# Patient Record
Sex: Female | Born: 1945
Health system: Southern US, Community
[De-identification: ages and names within clinical notes are randomized; demographics above are authoritative.]

## PROBLEM LIST (undated history)

## (undated) DIAGNOSIS — F0390 Unspecified dementia without behavioral disturbance: Secondary | ICD-10-CM

## (undated) DIAGNOSIS — E7800 Pure hypercholesterolemia, unspecified: Secondary | ICD-10-CM

## (undated) DIAGNOSIS — E039 Hypothyroidism, unspecified: Secondary | ICD-10-CM

## (undated) HISTORY — DX: Unspecified dementia, unspecified severity, without behavioral disturbance, psychotic disturbance, mood disturbance, and anxiety: F03.90

## (undated) HISTORY — DX: Pure hypercholesterolemia, unspecified: E78.00

## (undated) HISTORY — DX: Hypothyroidism, unspecified: E03.9

## (undated) HISTORY — PX: NO PAST SURGERIES: SHX2092

---

## 1997-07-10 ENCOUNTER — Other Ambulatory Visit: Admission: RE | Admit: 1997-07-10 | Discharge: 1997-07-10 | Payer: Self-pay | Admitting: *Deleted

## 1998-07-25 ENCOUNTER — Other Ambulatory Visit: Admission: RE | Admit: 1998-07-25 | Discharge: 1998-07-25 | Payer: Self-pay | Admitting: *Deleted

## 1999-07-31 ENCOUNTER — Other Ambulatory Visit: Admission: RE | Admit: 1999-07-31 | Discharge: 1999-07-31 | Payer: Self-pay | Admitting: *Deleted

## 1999-11-23 ENCOUNTER — Ambulatory Visit (HOSPITAL_COMMUNITY): Admission: RE | Admit: 1999-11-23 | Discharge: 1999-11-23 | Payer: Self-pay | Admitting: Orthopedic Surgery

## 1999-11-23 ENCOUNTER — Encounter: Payer: Self-pay | Admitting: Orthopedic Surgery

## 2000-08-13 ENCOUNTER — Other Ambulatory Visit: Admission: RE | Admit: 2000-08-13 | Discharge: 2000-08-13 | Payer: Self-pay | Admitting: *Deleted

## 2001-08-16 ENCOUNTER — Other Ambulatory Visit: Admission: RE | Admit: 2001-08-16 | Discharge: 2001-08-16 | Payer: Self-pay | Admitting: *Deleted

## 2001-12-08 ENCOUNTER — Ambulatory Visit (HOSPITAL_COMMUNITY): Admission: RE | Admit: 2001-12-08 | Discharge: 2001-12-08 | Payer: Self-pay | Admitting: Gastroenterology

## 2002-08-18 ENCOUNTER — Other Ambulatory Visit: Admission: RE | Admit: 2002-08-18 | Discharge: 2002-08-18 | Payer: Self-pay | Admitting: *Deleted

## 2016-05-05 DIAGNOSIS — Z1231 Encounter for screening mammogram for malignant neoplasm of breast: Secondary | ICD-10-CM | POA: Diagnosis not present

## 2016-05-06 DIAGNOSIS — E782 Mixed hyperlipidemia: Secondary | ICD-10-CM | POA: Diagnosis not present

## 2016-05-06 DIAGNOSIS — E039 Hypothyroidism, unspecified: Secondary | ICD-10-CM | POA: Diagnosis not present

## 2016-05-06 DIAGNOSIS — R7301 Impaired fasting glucose: Secondary | ICD-10-CM | POA: Diagnosis not present

## 2016-05-06 DIAGNOSIS — E559 Vitamin D deficiency, unspecified: Secondary | ICD-10-CM | POA: Diagnosis not present

## 2016-05-09 DIAGNOSIS — M810 Age-related osteoporosis without current pathological fracture: Secondary | ICD-10-CM | POA: Insufficient documentation

## 2016-05-09 DIAGNOSIS — E039 Hypothyroidism, unspecified: Secondary | ICD-10-CM | POA: Diagnosis not present

## 2016-05-09 DIAGNOSIS — E782 Mixed hyperlipidemia: Secondary | ICD-10-CM | POA: Diagnosis not present

## 2016-05-09 DIAGNOSIS — K219 Gastro-esophageal reflux disease without esophagitis: Secondary | ICD-10-CM | POA: Diagnosis not present

## 2016-05-09 DIAGNOSIS — R030 Elevated blood-pressure reading, without diagnosis of hypertension: Secondary | ICD-10-CM | POA: Diagnosis not present

## 2016-05-09 DIAGNOSIS — E559 Vitamin D deficiency, unspecified: Secondary | ICD-10-CM | POA: Insufficient documentation

## 2016-05-09 DIAGNOSIS — R7301 Impaired fasting glucose: Secondary | ICD-10-CM | POA: Diagnosis present

## 2016-05-09 DIAGNOSIS — J309 Allergic rhinitis, unspecified: Secondary | ICD-10-CM | POA: Diagnosis not present

## 2016-07-31 DIAGNOSIS — H52203 Unspecified astigmatism, bilateral: Secondary | ICD-10-CM | POA: Diagnosis not present

## 2016-07-31 DIAGNOSIS — H11122 Conjunctival concretions, left eye: Secondary | ICD-10-CM | POA: Diagnosis not present

## 2016-07-31 DIAGNOSIS — H25813 Combined forms of age-related cataract, bilateral: Secondary | ICD-10-CM | POA: Diagnosis not present

## 2016-07-31 DIAGNOSIS — H11003 Unspecified pterygium of eye, bilateral: Secondary | ICD-10-CM | POA: Diagnosis not present

## 2016-08-04 DIAGNOSIS — L820 Inflamed seborrheic keratosis: Secondary | ICD-10-CM | POA: Diagnosis not present

## 2016-08-04 DIAGNOSIS — L821 Other seborrheic keratosis: Secondary | ICD-10-CM | POA: Diagnosis not present

## 2016-09-08 DIAGNOSIS — E559 Vitamin D deficiency, unspecified: Secondary | ICD-10-CM | POA: Diagnosis not present

## 2016-09-08 DIAGNOSIS — R7301 Impaired fasting glucose: Secondary | ICD-10-CM | POA: Diagnosis not present

## 2016-09-08 DIAGNOSIS — E782 Mixed hyperlipidemia: Secondary | ICD-10-CM | POA: Diagnosis not present

## 2016-09-08 DIAGNOSIS — E039 Hypothyroidism, unspecified: Secondary | ICD-10-CM | POA: Diagnosis not present

## 2016-09-12 DIAGNOSIS — R030 Elevated blood-pressure reading, without diagnosis of hypertension: Secondary | ICD-10-CM | POA: Diagnosis not present

## 2016-09-12 DIAGNOSIS — E782 Mixed hyperlipidemia: Secondary | ICD-10-CM | POA: Diagnosis not present

## 2016-09-12 DIAGNOSIS — R7301 Impaired fasting glucose: Secondary | ICD-10-CM | POA: Diagnosis not present

## 2016-09-12 DIAGNOSIS — E039 Hypothyroidism, unspecified: Secondary | ICD-10-CM | POA: Diagnosis not present

## 2016-11-24 DIAGNOSIS — J069 Acute upper respiratory infection, unspecified: Secondary | ICD-10-CM | POA: Diagnosis not present

## 2016-12-04 DIAGNOSIS — H6993 Unspecified Eustachian tube disorder, bilateral: Secondary | ICD-10-CM | POA: Diagnosis not present

## 2016-12-04 DIAGNOSIS — H906 Mixed conductive and sensorineural hearing loss, bilateral: Secondary | ICD-10-CM | POA: Diagnosis not present

## 2016-12-18 DIAGNOSIS — H66003 Acute suppurative otitis media without spontaneous rupture of ear drum, bilateral: Secondary | ICD-10-CM | POA: Diagnosis not present

## 2016-12-30 DIAGNOSIS — H6123 Impacted cerumen, bilateral: Secondary | ICD-10-CM | POA: Diagnosis not present

## 2016-12-30 DIAGNOSIS — H906 Mixed conductive and sensorineural hearing loss, bilateral: Secondary | ICD-10-CM | POA: Diagnosis not present

## 2017-01-15 DIAGNOSIS — E782 Mixed hyperlipidemia: Secondary | ICD-10-CM | POA: Diagnosis not present

## 2017-01-15 DIAGNOSIS — E039 Hypothyroidism, unspecified: Secondary | ICD-10-CM | POA: Diagnosis not present

## 2017-01-15 DIAGNOSIS — R7301 Impaired fasting glucose: Secondary | ICD-10-CM | POA: Diagnosis not present

## 2017-01-16 DIAGNOSIS — R7301 Impaired fasting glucose: Secondary | ICD-10-CM | POA: Diagnosis not present

## 2017-01-16 DIAGNOSIS — E782 Mixed hyperlipidemia: Secondary | ICD-10-CM | POA: Diagnosis not present

## 2017-01-16 DIAGNOSIS — E039 Hypothyroidism, unspecified: Secondary | ICD-10-CM | POA: Diagnosis not present

## 2017-01-16 DIAGNOSIS — R030 Elevated blood-pressure reading, without diagnosis of hypertension: Secondary | ICD-10-CM | POA: Diagnosis not present

## 2017-05-06 DIAGNOSIS — Z1231 Encounter for screening mammogram for malignant neoplasm of breast: Secondary | ICD-10-CM | POA: Diagnosis not present

## 2017-05-12 DIAGNOSIS — N6001 Solitary cyst of right breast: Secondary | ICD-10-CM | POA: Diagnosis not present

## 2017-05-13 DIAGNOSIS — E039 Hypothyroidism, unspecified: Secondary | ICD-10-CM | POA: Diagnosis not present

## 2017-05-13 DIAGNOSIS — R7301 Impaired fasting glucose: Secondary | ICD-10-CM | POA: Diagnosis not present

## 2017-05-13 DIAGNOSIS — E782 Mixed hyperlipidemia: Secondary | ICD-10-CM | POA: Diagnosis not present

## 2017-05-13 DIAGNOSIS — R030 Elevated blood-pressure reading, without diagnosis of hypertension: Secondary | ICD-10-CM | POA: Diagnosis not present

## 2017-05-15 DIAGNOSIS — R7301 Impaired fasting glucose: Secondary | ICD-10-CM | POA: Diagnosis not present

## 2017-05-15 DIAGNOSIS — E039 Hypothyroidism, unspecified: Secondary | ICD-10-CM | POA: Diagnosis not present

## 2017-05-15 DIAGNOSIS — R030 Elevated blood-pressure reading, without diagnosis of hypertension: Secondary | ICD-10-CM | POA: Diagnosis not present

## 2017-05-15 DIAGNOSIS — E782 Mixed hyperlipidemia: Secondary | ICD-10-CM | POA: Diagnosis not present

## 2017-09-09 DIAGNOSIS — E039 Hypothyroidism, unspecified: Secondary | ICD-10-CM | POA: Diagnosis not present

## 2017-09-09 DIAGNOSIS — E782 Mixed hyperlipidemia: Secondary | ICD-10-CM | POA: Diagnosis not present

## 2017-09-09 DIAGNOSIS — R7301 Impaired fasting glucose: Secondary | ICD-10-CM | POA: Diagnosis not present

## 2017-09-11 DIAGNOSIS — Z79890 Hormone replacement therapy (postmenopausal): Secondary | ICD-10-CM | POA: Diagnosis not present

## 2017-09-11 DIAGNOSIS — E559 Vitamin D deficiency, unspecified: Secondary | ICD-10-CM | POA: Diagnosis not present

## 2017-09-11 DIAGNOSIS — K219 Gastro-esophageal reflux disease without esophagitis: Secondary | ICD-10-CM | POA: Diagnosis not present

## 2017-09-11 DIAGNOSIS — R7301 Impaired fasting glucose: Secondary | ICD-10-CM | POA: Diagnosis not present

## 2017-09-11 DIAGNOSIS — M810 Age-related osteoporosis without current pathological fracture: Secondary | ICD-10-CM | POA: Diagnosis not present

## 2017-09-11 DIAGNOSIS — R030 Elevated blood-pressure reading, without diagnosis of hypertension: Secondary | ICD-10-CM | POA: Diagnosis not present

## 2017-09-11 DIAGNOSIS — Z8349 Family history of other endocrine, nutritional and metabolic diseases: Secondary | ICD-10-CM | POA: Diagnosis not present

## 2017-09-11 DIAGNOSIS — E782 Mixed hyperlipidemia: Secondary | ICD-10-CM | POA: Diagnosis not present

## 2017-09-11 DIAGNOSIS — J309 Allergic rhinitis, unspecified: Secondary | ICD-10-CM | POA: Diagnosis not present

## 2017-09-11 DIAGNOSIS — E039 Hypothyroidism, unspecified: Secondary | ICD-10-CM | POA: Diagnosis not present

## 2017-12-01 DIAGNOSIS — R030 Elevated blood-pressure reading, without diagnosis of hypertension: Secondary | ICD-10-CM | POA: Diagnosis not present

## 2017-12-01 DIAGNOSIS — R7301 Impaired fasting glucose: Secondary | ICD-10-CM | POA: Diagnosis not present

## 2017-12-01 DIAGNOSIS — E039 Hypothyroidism, unspecified: Secondary | ICD-10-CM | POA: Diagnosis not present

## 2017-12-01 DIAGNOSIS — E782 Mixed hyperlipidemia: Secondary | ICD-10-CM | POA: Diagnosis not present

## 2017-12-01 DIAGNOSIS — E559 Vitamin D deficiency, unspecified: Secondary | ICD-10-CM | POA: Diagnosis not present

## 2017-12-01 DIAGNOSIS — K219 Gastro-esophageal reflux disease without esophagitis: Secondary | ICD-10-CM | POA: Diagnosis not present

## 2017-12-01 DIAGNOSIS — M810 Age-related osteoporosis without current pathological fracture: Secondary | ICD-10-CM | POA: Diagnosis not present

## 2018-02-01 DIAGNOSIS — R7301 Impaired fasting glucose: Secondary | ICD-10-CM | POA: Diagnosis not present

## 2018-02-01 DIAGNOSIS — R030 Elevated blood-pressure reading, without diagnosis of hypertension: Secondary | ICD-10-CM | POA: Diagnosis not present

## 2018-02-01 DIAGNOSIS — E782 Mixed hyperlipidemia: Secondary | ICD-10-CM | POA: Diagnosis not present

## 2018-02-01 DIAGNOSIS — E039 Hypothyroidism, unspecified: Secondary | ICD-10-CM | POA: Diagnosis not present

## 2018-02-04 DIAGNOSIS — H6993 Unspecified Eustachian tube disorder, bilateral: Secondary | ICD-10-CM | POA: Diagnosis not present

## 2018-02-04 DIAGNOSIS — H9313 Tinnitus, bilateral: Secondary | ICD-10-CM | POA: Diagnosis not present

## 2018-02-05 DIAGNOSIS — H9319 Tinnitus, unspecified ear: Secondary | ICD-10-CM | POA: Diagnosis not present

## 2018-02-05 DIAGNOSIS — R030 Elevated blood-pressure reading, without diagnosis of hypertension: Secondary | ICD-10-CM | POA: Diagnosis not present

## 2018-02-05 DIAGNOSIS — E559 Vitamin D deficiency, unspecified: Secondary | ICD-10-CM | POA: Diagnosis not present

## 2018-02-05 DIAGNOSIS — M810 Age-related osteoporosis without current pathological fracture: Secondary | ICD-10-CM | POA: Diagnosis not present

## 2018-02-05 DIAGNOSIS — K219 Gastro-esophageal reflux disease without esophagitis: Secondary | ICD-10-CM | POA: Diagnosis not present

## 2018-02-05 DIAGNOSIS — E782 Mixed hyperlipidemia: Secondary | ICD-10-CM | POA: Diagnosis not present

## 2018-02-05 DIAGNOSIS — R7301 Impaired fasting glucose: Secondary | ICD-10-CM | POA: Diagnosis not present

## 2018-02-05 DIAGNOSIS — E039 Hypothyroidism, unspecified: Secondary | ICD-10-CM | POA: Diagnosis not present

## 2018-04-13 DIAGNOSIS — S299XXA Unspecified injury of thorax, initial encounter: Secondary | ICD-10-CM | POA: Diagnosis not present

## 2018-04-13 DIAGNOSIS — W500XXA Accidental hit or strike by another person, initial encounter: Secondary | ICD-10-CM | POA: Diagnosis not present

## 2018-06-14 DIAGNOSIS — E782 Mixed hyperlipidemia: Secondary | ICD-10-CM | POA: Diagnosis not present

## 2018-06-14 DIAGNOSIS — E039 Hypothyroidism, unspecified: Secondary | ICD-10-CM | POA: Diagnosis not present

## 2018-06-14 DIAGNOSIS — R7301 Impaired fasting glucose: Secondary | ICD-10-CM | POA: Diagnosis not present

## 2018-06-18 DIAGNOSIS — K219 Gastro-esophageal reflux disease without esophagitis: Secondary | ICD-10-CM | POA: Diagnosis not present

## 2018-06-18 DIAGNOSIS — E039 Hypothyroidism, unspecified: Secondary | ICD-10-CM | POA: Diagnosis not present

## 2018-06-18 DIAGNOSIS — H9319 Tinnitus, unspecified ear: Secondary | ICD-10-CM | POA: Diagnosis not present

## 2018-06-18 DIAGNOSIS — E559 Vitamin D deficiency, unspecified: Secondary | ICD-10-CM | POA: Diagnosis not present

## 2018-06-18 DIAGNOSIS — M810 Age-related osteoporosis without current pathological fracture: Secondary | ICD-10-CM | POA: Diagnosis not present

## 2018-06-18 DIAGNOSIS — R7301 Impaired fasting glucose: Secondary | ICD-10-CM | POA: Diagnosis not present

## 2018-06-18 DIAGNOSIS — R030 Elevated blood-pressure reading, without diagnosis of hypertension: Secondary | ICD-10-CM | POA: Diagnosis not present

## 2018-06-18 DIAGNOSIS — E782 Mixed hyperlipidemia: Secondary | ICD-10-CM | POA: Diagnosis not present

## 2018-07-27 DIAGNOSIS — Z1231 Encounter for screening mammogram for malignant neoplasm of breast: Secondary | ICD-10-CM | POA: Diagnosis not present

## 2018-07-27 DIAGNOSIS — Z803 Family history of malignant neoplasm of breast: Secondary | ICD-10-CM | POA: Diagnosis not present

## 2018-07-29 DIAGNOSIS — N6311 Unspecified lump in the right breast, upper outer quadrant: Secondary | ICD-10-CM | POA: Diagnosis not present

## 2018-07-29 DIAGNOSIS — N6001 Solitary cyst of right breast: Secondary | ICD-10-CM | POA: Diagnosis not present

## 2018-08-09 DIAGNOSIS — L853 Xerosis cutis: Secondary | ICD-10-CM | POA: Diagnosis not present

## 2018-08-09 DIAGNOSIS — L820 Inflamed seborrheic keratosis: Secondary | ICD-10-CM | POA: Diagnosis not present

## 2018-08-30 DIAGNOSIS — E559 Vitamin D deficiency, unspecified: Secondary | ICD-10-CM | POA: Diagnosis not present

## 2018-08-30 DIAGNOSIS — E890 Postprocedural hypothyroidism: Secondary | ICD-10-CM | POA: Diagnosis not present

## 2018-08-30 DIAGNOSIS — K219 Gastro-esophageal reflux disease without esophagitis: Secondary | ICD-10-CM | POA: Diagnosis not present

## 2018-08-30 DIAGNOSIS — M810 Age-related osteoporosis without current pathological fracture: Secondary | ICD-10-CM | POA: Diagnosis not present

## 2018-08-30 DIAGNOSIS — R7301 Impaired fasting glucose: Secondary | ICD-10-CM | POA: Diagnosis not present

## 2018-08-30 DIAGNOSIS — H9319 Tinnitus, unspecified ear: Secondary | ICD-10-CM | POA: Diagnosis not present

## 2018-08-30 DIAGNOSIS — E782 Mixed hyperlipidemia: Secondary | ICD-10-CM | POA: Diagnosis not present

## 2018-08-30 DIAGNOSIS — R030 Elevated blood-pressure reading, without diagnosis of hypertension: Secondary | ICD-10-CM | POA: Diagnosis not present

## 2018-10-22 DIAGNOSIS — R7301 Impaired fasting glucose: Secondary | ICD-10-CM | POA: Diagnosis not present

## 2018-10-22 DIAGNOSIS — E559 Vitamin D deficiency, unspecified: Secondary | ICD-10-CM | POA: Diagnosis not present

## 2018-10-22 DIAGNOSIS — E782 Mixed hyperlipidemia: Secondary | ICD-10-CM | POA: Diagnosis not present

## 2018-10-22 DIAGNOSIS — E039 Hypothyroidism, unspecified: Secondary | ICD-10-CM | POA: Diagnosis not present

## 2018-10-29 DIAGNOSIS — R7301 Impaired fasting glucose: Secondary | ICD-10-CM | POA: Diagnosis not present

## 2018-10-29 DIAGNOSIS — E039 Hypothyroidism, unspecified: Secondary | ICD-10-CM | POA: Diagnosis not present

## 2018-10-29 DIAGNOSIS — K219 Gastro-esophageal reflux disease without esophagitis: Secondary | ICD-10-CM | POA: Diagnosis not present

## 2018-10-29 DIAGNOSIS — E559 Vitamin D deficiency, unspecified: Secondary | ICD-10-CM | POA: Diagnosis not present

## 2018-10-29 DIAGNOSIS — H9319 Tinnitus, unspecified ear: Secondary | ICD-10-CM | POA: Diagnosis not present

## 2018-10-29 DIAGNOSIS — R030 Elevated blood-pressure reading, without diagnosis of hypertension: Secondary | ICD-10-CM | POA: Diagnosis not present

## 2018-10-29 DIAGNOSIS — E782 Mixed hyperlipidemia: Secondary | ICD-10-CM | POA: Diagnosis not present

## 2018-10-29 DIAGNOSIS — M810 Age-related osteoporosis without current pathological fracture: Secondary | ICD-10-CM | POA: Diagnosis not present

## 2018-11-23 DIAGNOSIS — K120 Recurrent oral aphthae: Secondary | ICD-10-CM | POA: Diagnosis not present

## 2018-12-01 DIAGNOSIS — R140 Abdominal distension (gaseous): Secondary | ICD-10-CM | POA: Diagnosis not present

## 2018-12-01 DIAGNOSIS — Z8371 Family history of colonic polyps: Secondary | ICD-10-CM | POA: Diagnosis not present

## 2018-12-07 DIAGNOSIS — E038 Other specified hypothyroidism: Secondary | ICD-10-CM | POA: Diagnosis not present

## 2018-12-07 DIAGNOSIS — Z23 Encounter for immunization: Secondary | ICD-10-CM | POA: Diagnosis not present

## 2018-12-07 DIAGNOSIS — R413 Other amnesia: Secondary | ICD-10-CM | POA: Diagnosis not present

## 2018-12-07 DIAGNOSIS — Z807 Family history of other malignant neoplasms of lymphoid, hematopoietic and related tissues: Secondary | ICD-10-CM | POA: Diagnosis not present

## 2018-12-07 DIAGNOSIS — Z1331 Encounter for screening for depression: Secondary | ICD-10-CM | POA: Diagnosis not present

## 2018-12-07 DIAGNOSIS — Z7689 Persons encountering health services in other specified circumstances: Secondary | ICD-10-CM | POA: Diagnosis not present

## 2018-12-10 ENCOUNTER — Other Ambulatory Visit: Payer: Self-pay | Admitting: Internal Medicine

## 2018-12-10 DIAGNOSIS — E039 Hypothyroidism, unspecified: Secondary | ICD-10-CM

## 2018-12-10 DIAGNOSIS — E538 Deficiency of other specified B group vitamins: Secondary | ICD-10-CM

## 2018-12-10 DIAGNOSIS — R413 Other amnesia: Secondary | ICD-10-CM

## 2018-12-10 DIAGNOSIS — Z807 Family history of other malignant neoplasms of lymphoid, hematopoietic and related tissues: Secondary | ICD-10-CM

## 2018-12-21 ENCOUNTER — Ambulatory Visit
Admission: RE | Admit: 2018-12-21 | Discharge: 2018-12-21 | Disposition: A | Discharge status: Home / Self Care | Payer: PPO | Source: Ambulatory Visit | Attending: Internal Medicine | Admitting: Internal Medicine

## 2018-12-21 ENCOUNTER — Other Ambulatory Visit: Payer: Self-pay

## 2018-12-21 DIAGNOSIS — R413 Other amnesia: Secondary | ICD-10-CM | POA: Diagnosis not present

## 2018-12-21 DIAGNOSIS — Z807 Family history of other malignant neoplasms of lymphoid, hematopoietic and related tissues: Secondary | ICD-10-CM

## 2018-12-21 DIAGNOSIS — E039 Hypothyroidism, unspecified: Secondary | ICD-10-CM

## 2018-12-21 DIAGNOSIS — E538 Deficiency of other specified B group vitamins: Secondary | ICD-10-CM

## 2018-12-27 ENCOUNTER — Ambulatory Visit: Payer: PPO | Admitting: Neurology

## 2018-12-28 DIAGNOSIS — E038 Other specified hypothyroidism: Secondary | ICD-10-CM | POA: Diagnosis not present

## 2018-12-28 DIAGNOSIS — F0390 Unspecified dementia without behavioral disturbance: Secondary | ICD-10-CM | POA: Diagnosis not present

## 2018-12-28 DIAGNOSIS — E538 Deficiency of other specified B group vitamins: Secondary | ICD-10-CM | POA: Diagnosis not present

## 2018-12-28 DIAGNOSIS — E7801 Familial hypercholesterolemia: Secondary | ICD-10-CM | POA: Diagnosis not present

## 2018-12-28 DIAGNOSIS — R413 Other amnesia: Secondary | ICD-10-CM | POA: Diagnosis not present

## 2018-12-28 DIAGNOSIS — E559 Vitamin D deficiency, unspecified: Secondary | ICD-10-CM | POA: Diagnosis not present

## 2018-12-28 DIAGNOSIS — R410 Disorientation, unspecified: Secondary | ICD-10-CM | POA: Diagnosis not present

## 2018-12-28 DIAGNOSIS — E46 Unspecified protein-calorie malnutrition: Secondary | ICD-10-CM | POA: Diagnosis not present

## 2019-01-07 ENCOUNTER — Other Ambulatory Visit: Payer: Self-pay

## 2019-01-07 ENCOUNTER — Encounter (INDEPENDENT_AMBULATORY_CARE_PROVIDER_SITE_OTHER): Payer: Self-pay | Admitting: Otolaryngology

## 2019-01-07 ENCOUNTER — Ambulatory Visit (INDEPENDENT_AMBULATORY_CARE_PROVIDER_SITE_OTHER): Payer: PPO | Admitting: Otolaryngology

## 2019-01-07 VITALS — Temp 97.2°F

## 2019-01-07 DIAGNOSIS — H6123 Impacted cerumen, bilateral: Secondary | ICD-10-CM

## 2019-01-07 NOTE — Progress Notes (Signed)
HPI: Kaitlyn Bryant is a 73 y.o. female who presents for evaluation of wax buildup in her ears.  She has history of decreased hearing as well as tinnitus.  No past medical history on file.  Social History   Socioeconomic History  . Marital status: Married    Spouse name: Not on file  . Number of children: Not on file  . Years of education: Not on file  . Highest education level: Not on file  Occupational History  . Not on file  Social Needs  . Financial resource strain: Not on file  . Food insecurity    Worry: Not on file    Inability: Not on file  . Transportation needs    Medical: Not on file    Non-medical: Not on file  Tobacco Use  . Smoking status: Never Smoker  . Smokeless tobacco: Never Used  Substance and Sexual Activity  . Alcohol use: Not on file  . Drug use: Not on file  . Sexual activity: Not on file  Lifestyle  . Physical activity    Days per week: Not on file    Minutes per session: Not on file  . Stress: Not on file  Relationships  . Social Herbalist on phone: Not on file    Gets together: Not on file    Attends religious service: Not on file    Active member of club or organization: Not on file    Attends meetings of clubs or organizations: Not on file    Relationship status: Not on file  Other Topics Concern  . Not on file  Social History Narrative  . Not on file   No family history on file. Not on File Prior to Admission medications   Not on File     Positive ROS: Otherwise negative  All other systems have been reviewed and were otherwise negative with the exception of those mentioned in the HPI and as above.  Physical Exam: Constitutional: Alert, well-appearing, no acute distress Ears: External ears without lesions or tenderness. Ear canals she has small ear canals bilaterally with moderate wax buildup that was cleaned with a curette.  TMs were clear. Nasal: External nose without lesions. Clear nasal passages Oral:  Oropharynx clear. Neck: No palpable adenopathy or masses Respiratory: Breathing comfortably  Skin: No facial/neck lesions or rash noted.  Cerumen impaction removal  Date/Time: 01/07/2019 2:54 PM Performed by: Rozetta Nunnery, MD Authorized by: Rozetta Nunnery, MD   Consent:    Consent obtained:  Verbal   Consent given by:  Patient   Risks discussed:  Pain and bleeding Procedure details:    Location:  L ear and R ear   Procedure type: curette   Post-procedure details:    Inspection:  TM intact and canal normal   Hearing quality:  Improved   Patient tolerance of procedure:  Tolerated well, no immediate complications    Assessment: Cerumen buildup  Plan: This was cleaned in the office she will follow-up as needed  Radene Journey, MD

## 2019-01-09 ENCOUNTER — Encounter (INDEPENDENT_AMBULATORY_CARE_PROVIDER_SITE_OTHER): Payer: Self-pay

## 2019-01-18 ENCOUNTER — Encounter: Payer: Self-pay | Admitting: Neurology

## 2019-01-18 ENCOUNTER — Ambulatory Visit (INDEPENDENT_AMBULATORY_CARE_PROVIDER_SITE_OTHER): Payer: PPO | Admitting: Neurology

## 2019-01-18 ENCOUNTER — Other Ambulatory Visit: Payer: Self-pay

## 2019-01-18 VITALS — BP 131/70 | HR 59 | Temp 97.3°F | Ht 64.5 in | Wt 119.0 lb

## 2019-01-18 DIAGNOSIS — E7800 Pure hypercholesterolemia, unspecified: Secondary | ICD-10-CM

## 2019-01-18 DIAGNOSIS — F0391 Unspecified dementia with behavioral disturbance: Secondary | ICD-10-CM

## 2019-01-18 DIAGNOSIS — F0390 Unspecified dementia without behavioral disturbance: Secondary | ICD-10-CM | POA: Diagnosis not present

## 2019-01-18 DIAGNOSIS — F0280 Dementia in other diseases classified elsewhere without behavioral disturbance: Secondary | ICD-10-CM | POA: Diagnosis not present

## 2019-01-18 DIAGNOSIS — E039 Hypothyroidism, unspecified: Secondary | ICD-10-CM | POA: Insufficient documentation

## 2019-01-18 DIAGNOSIS — G309 Alzheimer's disease, unspecified: Secondary | ICD-10-CM | POA: Diagnosis not present

## 2019-01-18 DIAGNOSIS — G3109 Other frontotemporal dementia: Secondary | ICD-10-CM

## 2019-01-18 DIAGNOSIS — F03918 Unspecified dementia, unspecified severity, with other behavioral disturbance: Secondary | ICD-10-CM | POA: Insufficient documentation

## 2019-01-18 NOTE — Patient Instructions (Signed)
Signs and Symptoms of Frontotemporal Dementia Each case of FTD is different, but the illness generally becomes more distinguishable from other brain conditions as it progresses. Symptoms may occur in clusters, and some may be more prevalent in early or later stages. Here is a list of ten signs of FTD: Poor judgment Loss of empathy Socially inappropriate behavior Lack of inhibition Repetitive compulsive behavior Inability to concentrate or plan Frequent, abrupt mood changes Speech difficulties Problems with balance or movement Memory loss    Frontotemporal Dementia  Frontotemporal dementia (FTD) is a rare, progressive brain disorder that causes memory loss. FTD describes a range of diseases that often start with changes in behavior, speech, and thinking. As FTD progresses, it affects short-term memory. Over time, FTD causes the frontal and temporal anterior lobes of the brain to shrink. These are the parts of the brain that control behavior and speech. There are three main types of FTD:  Behavioral variant FTD. This is the most common type and was previously called Pick disease.  Progressive agrammatic aphasia.  Semantic variant primary progressive aphasia. FTD is one of the most common causes of progressive memory loss in people younger than 60 years. The disease progresses faster in some people than in others. In some families, FTD can be associated with amyotrophic lateral sclerosis (ALS). There is no cure for FTD, but treatment and supportive care can improve a person's quality of life. What are the causes? The exact cause of this condition is not known, although many genetic mutations are known to cause the disease. What increases the risk? This condition is more likely to occur in people who have a family history of FTD. Family members of people with FTD should think about genetic counseling. What are the signs or symptoms? Signs and symptoms of FTD usually start between the ages  of 55-65 years. Symptoms may include:  Impulsive, poorly controlled, inappropriate, or embarrassing behavior.  Lack of motivation and interest (apathy).  Irritability and agitation.  Neglect of personal hygiene.  Withdrawal.  Inappropriate crying or laughing (pseudobulbar affect).  Repetitive behaviors.  Impulsive eating  Lack of concern for others.  Failure to recognize behavioral changes.  Loss of the ability to speak fluently.  Inability to write or read.  Slurred speech.  Loss of vocabulary.  New drug or alcohol abuse. Short-term memory loss is also a symptom later in the disease. How is this diagnosed? Your health care provider may suspect FTD if you have worsening behavior or speech difficulties. You may need to see specialists in brain and behavioral health who will collect your medical history and do a neurological examination. The following tests may be done:  Blood tests to rule out other causes, like vitamin deficiency, harmful effects of substances (toxicities), and infections.  Spinal tap (lumbar puncture) to check spinal fluid samples for abnormal proteins.  Imaging tests, such as a CT scan, PET scan, or MRI. These can show brain changes that suggest FTD or another brain disorder.  Memory testing (neuropsychological testing), which involves several hours of standardized tests to check the many functions of the brain. How is this treated? There is no cure for this condition and the progression of FTD cannot be stopped. Support at home is the most important aspect of managing FTD. Ask about caregiving resources in your community. Management of this condition may include:  Antidepressants to help with apathy.  Medicines to treat pseudobulbar affect.  Sedative medicines to control aggressive or dangerous behavior.  Speech and language therapy.  Behavioral therapy.  Occupational therapy to help with home safety and activity. Institutional or  supportive care may eventually be needed. Follow these instructions at home: Eating and drinking  Follow a healthy diet. Eat foods that are high in fiber, such as fresh fruits and vegetables, whole grains, and beans.  Avoid having too much sugar and caffeine in your diet.  Watch for signs of compulsive eating, which can lead to other health problems.  Do not drink alcohol.  Drink enough fluid to keep your urine pale yellow. Lifestyle  Keep a regular routine.  Avoid stress and new situations.  Avoid any activities that may trigger aggressive behavior.  Schedule regular, enjoyable, and supervised physical activity. General instructions  Take over-the-counter and prescription medicines only as told by your health care provider.  If you were given a bracelet that tracks your location, make sure you wear it.  Work with your health care provider to determine what you need help with and what your safety needs are.  Keep all follow-up visits, including any therapy visits, as told by your health care provider. This is important. Contact a health care provider if:  Your symptoms change or get worse.  It becomes more difficult or stressful to be cared for at home. Get help right away if:  It is no longer possible to be cared for at home.  You or your family members become concerned for your safety. Summary  Frontotemporal dementia (FTD) is a rare, progressive brain disorder that causes memory loss.  There is no cure for this condition and the progression of FTD cannot be stopped.  Support at home is the most important aspect of managing FTD. Ask about caregiving resources in your community.  Work with your health care provider to determine what you need help with and what your safety needs are. This information is not intended to replace advice given to you by your health care provider. Make sure you discuss any questions you have with your health care provider. Document  Released: 01/10/2002 Document Revised: 05/13/2018 Document Reviewed: 04/15/2016 Elsevier Patient Education  [place].

## 2019-01-18 NOTE — Progress Notes (Signed)
[facility]    Provider:  Dr Lucia Gaskins Requesting Provider: Charlane Ferretti, DO Primary Care Provider:  Altheimer, Casimiro Needle, MD  CC:  Memory loss  HPI:  Kaitlyn Bryant is a 73 y.o. female here as requested by Charlane Ferretti, DO for memory loss. PMHx hypothyroid,heart disease, hyperlipidemia on Repatha, hypothyroidism status post ablation stable on levothyroxine..  I reviewed Dr. Acie Fredrickson notes, and patient is here with her husband who provides much information.  Patient is independent with bathing dressing, toileting, feeding also using the telephone, shopping, meal preparation, light housework, laundry, arranging transportation, taking medications correctly and managing finances.  Mini-Mental status exam was 22 out of 30.  Never smoker, exercises 5 times a week swimming and walking.  She was seen last for yearly physical, last year she has been exhibiting behaviors commonly associated with dementia such as increased short-term memory loss, struggles with insight, poor judgment and decision-making, difficulty in processing new information, difficulty in finding words to express herself, also conserver driving skills that she no longer has the judgment and skills to continue driving.  Husband expresses his believe that cognitive and laboratory testing is needed at the time and to provide better insight into future care as well as make a diagnosis.  She was advised not to drive anymore.  Short term memory loss started a year ago in the summer. They noticed judgement issues, she felt prey to a number of internet scams, lost a lot of money. Also lots of denial that she did anything wrong, she was convinced one of the scams was the FBI and the other one was the [place] nations and [place]. She started being very secretive about it all. Poor insight. Was also short-term memory thought it was just aging. Judgement and insight poor, poor choices. Family noticed it. Very judgemental, she is  using the word "asshole" and she wouldn;t do that in the past. She repetitively watches shows and watches them multiple times the same episode, repetitive compulsive behavior. Difficult planning, not logical. Also short-term memory and repeating conversations, searching for words.   Reviewed notes, labs and imaging from outside physicians, which showed:  reviewed CT of the head images and agree with the following: No acute or reversible finding. Generalized brain atrophy with relative frontal lobe predominance.  Labs include BUN 14, creatinine 1.2, otherwise unremarkable CBC and CMP, RPR nonreactive, B12 996.  Review of Systems: Patient complains of symptoms per HPI as well as the following symptoms: memory loss. Pertinent negatives and positives per HPI. All others negative.   Social History   Socioeconomic History  . Marital status: Married    Spouse name: Not on file  . Number of children: Not on file  . Years of education: Not on file  . Highest education level: Not on file  Occupational History  . Not on file  Tobacco Use  . Smoking status: Never Smoker  . Smokeless tobacco: Never Used  Substance and Sexual Activity  . Alcohol use: Never  . Drug use: Never  . Sexual activity: Not on file  Other Topics Concern  . Not on file  Social History Narrative   Lives with husband   Social Determinants of Health   Financial Resource Strain:   . Difficulty of Paying Living Expenses: Not on file  Food Insecurity:   . Worried About Programme researcher, broadcasting/film/video in the Last Year: Not on file  . Ran Out of Food in the Last Year: Not on file  Transportation  Needs:   . Lack of Transportation (Medical): Not on file  . Lack of Transportation (Non-Medical): Not on file  Physical Activity:   . Days of Exercise per Week: Not on file  . Minutes of Exercise per Session: Not on file  Stress:   . Feeling of Stress : Not on file  Social Connections:   . Frequency of Communication with Friends and  Family: Not on file  . Frequency of Social Gatherings with Friends and Family: Not on file  . Attends Religious Services: Not on file  . Active Member of Clubs or Organizations: Not on file  . Attends BankerClub or Organization Meetings: Not on file  . Marital Status: Not on file  Intimate Partner Violence:   . Fear of Current or Ex-Partner: Not on file  . Emotionally Abused: Not on file  . Physically Abused: Not on file  . Sexually Abused: Not on file    Family History  Problem Relation Age of Onset  . Liver disease Mother   . Dementia Mother   . Hypothyroidism Sister   . Liver cancer Sister   . Cancer Sister        splenic lymphoma  . Cirrhosis Sister        alcoholism    Past Medical History:  Diagnosis Date  . Dementia (HCC)   . High cholesterol   . Hypothyroid     Patient Active Problem List   Diagnosis Date Noted  . Dementia with behavioral disturbance (HCC) 01/18/2019  . Hypothyroid   . High cholesterol     Past Surgical History:  Procedure Laterality Date  . NO PAST SURGERIES      Current Outpatient Medications  Medication Sig Dispense Refill  . Biotin 5000 MCG CAPS Take by mouth daily.    . Cholecalciferol 50 MCG (2000 UT) CHEW Take 1 gummie once a day    . Coenzyme Q10 (COQ-10 PO) Take 300 mg by mouth daily.    Marland Kitchen. levothyroxine (SYNTHROID) 75 MCG tablet TAKE 1 TABLET IN THE MORNING ON EMPTY STOMACH FOR THYROID    . Multiple Vitamin (MULTI-VITAMIN) tablet Take 1 tablet once a day    . Pseudoephedrine HCl (SUDAFED PO) Take by mouth.    Marland Kitchen. REPATHA SURECLICK 140 MG/ML SOAJ Inject 1 mL into the skin every 14 (fourteen) days.    Marland Kitchen. UNABLE TO FIND daily. Med Name: Areds 2    . EPINEPHrine 0.3 mg/0.3 mL IJ SOAJ injection Inject as directed as needed for severe allergic reaction     No current facility-administered medications for this visit.    Allergies as of 01/18/2019 - Review Complete 01/18/2019  Allergen Reaction Noted  . Methimazole Anaphylaxis 03/30/2016    . Sulfamethoxazole Shortness Of Breath 05/08/2016  . Alendronate Other (See Comments) 05/08/2016  . Ibandronic acid Other (See Comments) 05/08/2016  . Risedronate Other (See Comments) 05/08/2016  . Iodine Other (See Comments) 03/30/2016  . Amoxicillin-pot clavulanate Rash 01/16/2017  . Aspirin Other (See Comments) 05/08/2016  . Atorvastatin Other (See Comments) 03/30/2016  . Colesevelam  05/08/2016  . Ezetimibe Other (See Comments) 03/30/2016  . Fluvastatin Other (See Comments) 03/30/2016  . Niacin Rash 03/30/2016  . Other Other (See Comments) 05/08/2016  . Peppermint flavor Other (See Comments) 05/08/2016  . Pitavastatin Other (See Comments) 03/30/2016  . Raloxifene Other (See Comments) 05/08/2016  . Rosuvastatin Other (See Comments) 03/30/2016  . Shellfish allergy Swelling and Rash 01/18/2019    Vitals: BP 131/70 (BP Location: Right Arm, Patient  Position: Sitting)   Pulse (!) 59   Temp (!) 97.3 F (36.3 C) Comment: husband 98.3  Ht 5' 4.5" (1.638 m)   Wt 119 lb (54 kg)   BMI 20.11 kg/m  Last Weight:  Wt Readings from Last 1 Encounters:  01/18/19 119 lb (54 kg)   Last Height:   Ht Readings from Last 1 Encounters:  01/18/19 5' 4.5" (1.638 m)     Physical exam: Exam: Gen: NAD, conversant, well nourised, thin, well groomed                     CV: RRR, no MRG. No Carotid Bruits. No peripheral edema, warm, nontender Eyes: Conjunctivae clear without exudates or hemorrhage  Neuro: Detailed Neurologic Exam  Speech:    Speech is normal; fluent and spontaneous with normal comprehension.  Cognition:    The patient is oriented to person, place, and time;     recent and remote memory impaired;     language fluent;     Impaired attention, concentration,  fund of knowledge Cranial Nerves:    The pupils are equal, round, and reactive to light. The fundi are flat. Visual fields are full to finger confrontation. Extraocular movements are intact. Trigeminal sensation is intact  and the muscles of mastication are normal. The face is symmetric. The palate elevates in the midline. Hearing intact. Voice is normal. Shoulder shrug is normal. The tongue has normal motion without fasciculations.   Coordination:    Normal finger to nose and heel to shin.   Gait:    Heel-toe and tandem gait are normal.   Motor Observation:    No asymmetry, no atrophy, and no involuntary movements noted. Tone:    Normal muscle tone.    Posture:    Posture is normal. normal erect    Strength:    Strength is V/V in the upper and lower limbs.      Sensation: intact to LT     Reflex Exam:  DTR's:    Deep tendon reflexes in the upper and lower extremities are brisk bilaterally.   Toes:    The toes are downgoing bilaterally.   Clonus:    2-3 beats clonus AJs.   +jaw jerk and +snout (frontal release signs)  Assessment/Plan: 73 year old patient here with her husband for cognitive changes concerning for dementia. They are more focused on her memory loss with a family history of Alzheimers however she presented with poor judgement, repetitive compulsive behavior, change in personality and swearing and  CT scan of her head showed frontotemporal atrophy predominance questioning FTD or overlap of both Alzheimers and FTD need further studies. MMSE 22/30.  -FDG nuclear medicine PET scan for dementia to differentiate between Alzheimer's versus frontotemporal which can aid in treatment and availability of clinical trials -Formal neurocognitive testing -Patient to return to clinic after the above completed or in 4 months - discussed at length with patient and husband.  Orders Placed This Encounter  Procedures  . NM PET Metabolic Brain  . Ambulatory referral to Neuropsychology    Cc: Sueanne Margarita, DO,  Altheimer, Legrand Como, MD  Sarina Ill, MD  [facility] [address] [place], [place] [address]  Phone [phone] Fax [phone]

## 2019-02-07 DIAGNOSIS — M810 Age-related osteoporosis without current pathological fracture: Secondary | ICD-10-CM | POA: Diagnosis not present

## 2019-02-07 DIAGNOSIS — E782 Mixed hyperlipidemia: Secondary | ICD-10-CM | POA: Diagnosis not present

## 2019-02-07 DIAGNOSIS — R7301 Impaired fasting glucose: Secondary | ICD-10-CM | POA: Diagnosis not present

## 2019-02-07 DIAGNOSIS — E039 Hypothyroidism, unspecified: Secondary | ICD-10-CM | POA: Diagnosis not present

## 2019-02-07 DIAGNOSIS — R030 Elevated blood-pressure reading, without diagnosis of hypertension: Secondary | ICD-10-CM | POA: Diagnosis not present

## 2019-02-09 DIAGNOSIS — E039 Hypothyroidism, unspecified: Secondary | ICD-10-CM | POA: Diagnosis not present

## 2019-02-09 DIAGNOSIS — K219 Gastro-esophageal reflux disease without esophagitis: Secondary | ICD-10-CM | POA: Diagnosis not present

## 2019-02-15 DIAGNOSIS — R140 Abdominal distension (gaseous): Secondary | ICD-10-CM | POA: Diagnosis not present

## 2019-02-24 ENCOUNTER — Ambulatory Visit: Payer: PPO | Attending: Internal Medicine

## 2019-02-24 ENCOUNTER — Encounter (HOSPITAL_COMMUNITY): Payer: PPO

## 2019-02-24 DIAGNOSIS — Z23 Encounter for immunization: Secondary | ICD-10-CM

## 2019-02-24 NOTE — Progress Notes (Signed)
   Covid-19 Vaccination Clinic  Name:  TZIPPORAH NAGORSKI    MRN: [id number] DOB: Jun 10, 1945  02/24/2019  Ms. Spadoni was observed post Covid-19 immunization for 15 minutes without incidence. She was provided with Vaccine Information Sheet and instruction to access the V-Safe system.   Ms. Delaney was instructed to call 911 with any severe reactions post vaccine: Marland Kitchen Difficulty breathing  . Swelling of your face and throat  . A fast heartbeat  . A bad rash all over your body  . Dizziness and weakness    Immunizations Administered    Name Date Dose VIS Date Route   Pfizer COVID-19 Vaccine 02/24/2019  9:51 AM 0.3 mL 01/14/2019 Intramuscular   Manufacturer: [employer], [employer]   Lot: [id number]   NDC: [address]-0

## 2019-03-17 ENCOUNTER — Ambulatory Visit: Payer: PPO | Attending: Internal Medicine

## 2019-03-17 DIAGNOSIS — Z23 Encounter for immunization: Secondary | ICD-10-CM | POA: Insufficient documentation

## 2019-03-17 NOTE — Progress Notes (Signed)
   Covid-19 Vaccination Clinic  Name:  TAYVIA FAUGHNAN    MRN: [id number] DOB: 17-May-1945  03/17/2019  Ms. Verret was observed post Covid-19 immunization for 15 minutes without incidence. She was provided with Vaccine Information Sheet and instruction to access the V-Safe system.   Ms. Gillott was instructed to call 911 with any severe reactions post vaccine: Marland Kitchen Difficulty breathing  . Swelling of your face and throat  . A fast heartbeat  . A bad rash all over your body  . Dizziness and weakness    Immunizations Administered    Name Date Dose VIS Date Route   Pfizer COVID-19 Vaccine 03/17/2019 10:11 AM 0.3 mL 01/14/2019 Intramuscular   Manufacturer: [employer], [employer]   Lot: [id number]   NDC: [address]-5

## 2019-04-08 DIAGNOSIS — E559 Vitamin D deficiency, unspecified: Secondary | ICD-10-CM | POA: Diagnosis not present

## 2019-04-08 DIAGNOSIS — F0391 Unspecified dementia with behavioral disturbance: Secondary | ICD-10-CM | POA: Diagnosis not present

## 2019-04-08 DIAGNOSIS — E038 Other specified hypothyroidism: Secondary | ICD-10-CM | POA: Diagnosis not present

## 2019-04-08 DIAGNOSIS — E782 Mixed hyperlipidemia: Secondary | ICD-10-CM | POA: Diagnosis not present

## 2019-04-08 DIAGNOSIS — R7301 Impaired fasting glucose: Secondary | ICD-10-CM | POA: Diagnosis not present

## 2019-04-08 DIAGNOSIS — K219 Gastro-esophageal reflux disease without esophagitis: Secondary | ICD-10-CM | POA: Diagnosis not present

## 2019-04-08 DIAGNOSIS — R030 Elevated blood-pressure reading, without diagnosis of hypertension: Secondary | ICD-10-CM | POA: Diagnosis not present

## 2019-04-08 DIAGNOSIS — M810 Age-related osteoporosis without current pathological fracture: Secondary | ICD-10-CM | POA: Diagnosis not present

## 2019-04-08 DIAGNOSIS — E039 Hypothyroidism, unspecified: Secondary | ICD-10-CM | POA: Diagnosis not present

## 2019-04-12 ENCOUNTER — Ambulatory Visit (HOSPITAL_COMMUNITY)
Admission: RE | Admit: 2019-04-12 | Discharge: 2019-04-12 | Disposition: A | Discharge status: Home / Self Care | Payer: PPO | Source: Ambulatory Visit | Attending: Neurology | Admitting: Neurology

## 2019-04-12 ENCOUNTER — Other Ambulatory Visit: Payer: Self-pay

## 2019-04-12 DIAGNOSIS — Z79899 Other long term (current) drug therapy: Secondary | ICD-10-CM | POA: Diagnosis not present

## 2019-04-12 DIAGNOSIS — F0280 Dementia in other diseases classified elsewhere without behavioral disturbance: Secondary | ICD-10-CM | POA: Insufficient documentation

## 2019-04-12 DIAGNOSIS — G3109 Other frontotemporal dementia: Secondary | ICD-10-CM | POA: Insufficient documentation

## 2019-04-12 DIAGNOSIS — F0391 Unspecified dementia with behavioral disturbance: Secondary | ICD-10-CM | POA: Diagnosis not present

## 2019-04-12 DIAGNOSIS — F0390 Unspecified dementia without behavioral disturbance: Secondary | ICD-10-CM | POA: Diagnosis not present

## 2019-04-12 DIAGNOSIS — G309 Alzheimer's disease, unspecified: Secondary | ICD-10-CM | POA: Insufficient documentation

## 2019-04-12 LAB — GLUCOSE, CAPILLARY: Glucose-Capillary: 91 mg/dL (ref 70–99)

## 2019-04-12 MED ORDER — FLUDEOXYGLUCOSE F - 18 (FDG) INJECTION
10.2800 | Freq: Once | INTRAVENOUS | Status: AC
  Administered 2019-04-12: 10.28 via INTRAVENOUS

## 2019-04-14 ENCOUNTER — Telehealth: Payer: Self-pay | Admitting: Neurology

## 2019-04-14 NOTE — Telephone Encounter (Signed)
I called and spoke with wife, I have informed her the results of the PET scan below. Her husband was not in the home, I asked patient if I could call back and discussed the results with her husband as well and she said there was no need. I would really like to make sure that the husband knows the results however patient again told me no need to talk to them. We do have an appointment in April and I will speak with both of them at that time.   NM PET Scan: Decreased relative cortical metabolism within the anterior frontal lobes is a pattern typical of frontotemporal dementia pathology.

## 2019-04-27 DIAGNOSIS — M810 Age-related osteoporosis without current pathological fracture: Secondary | ICD-10-CM | POA: Diagnosis not present

## 2019-04-27 DIAGNOSIS — M85851 Other specified disorders of bone density and structure, right thigh: Secondary | ICD-10-CM | POA: Diagnosis not present
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
## 2019-05-23 ENCOUNTER — Other Ambulatory Visit: Payer: Self-pay

## 2019-05-23 ENCOUNTER — Ambulatory Visit: Payer: Medicare HMO | Admitting: Neurology

## 2019-05-23 ENCOUNTER — Encounter: Payer: Self-pay | Admitting: Neurology

## 2019-05-23 VITALS — BP 137/74 | HR 57 | Temp 97.1°F | Ht 64.5 in | Wt 120.0 lb

## 2019-05-23 DIAGNOSIS — F0280 Dementia in other diseases classified elsewhere without behavioral disturbance: Secondary | ICD-10-CM | POA: Diagnosis not present

## 2019-05-23 DIAGNOSIS — F0281 Dementia in other diseases classified elsewhere with behavioral disturbance: Secondary | ICD-10-CM | POA: Diagnosis not present

## 2019-05-23 DIAGNOSIS — G3109 Other frontotemporal dementia: Secondary | ICD-10-CM

## 2019-05-23 DIAGNOSIS — F02818 Dementia in other diseases classified elsewhere, unspecified severity, with other behavioral disturbance: Secondary | ICD-10-CM

## 2019-05-23 MED ORDER — CITALOPRAM HYDROBROMIDE 10 MG PO TABS: 10.0000 mg | ORAL_TABLET | Freq: Every day | ORAL | 3 refills | Status: DC

## 2019-05-23 NOTE — Patient Instructions (Addendum)
Start Celexa Referral to Kaitlyn Bryant (social worker) Kaitlyn Bryant. Kaitlyn Bryant, [place]  Therapist  [place]  [phone]  Referral to Kaitlyn Bryant (psychiatry)  Address: [address], [place], [place] [address] Phone: [phone]  Citalopram tablets What is this medicine? CITALOPRAM (sye TAL oh pram) is a medicine for depression. This medicine may be used for other purposes; ask your health care provider or pharmacist if you have questions. COMMON BRAND NAME(S): Celexa What should I tell my health care provider before I take this medicine? They need to know if you have any of these conditions:  bleeding disorders  bipolar disorder or a family history of bipolar disorder  glaucoma  heart disease  history of irregular heartbeat  kidney disease  liver disease  low levels of magnesium or potassium in the blood  receiving electroconvulsive therapy  seizures  suicidal thoughts, plans, or attempt; a previous suicide attempt by you or a family member  take medicines that treat or prevent blood clots  thyroid disease  an unusual or allergic reaction to citalopram, escitalopram, other medicines, foods, dyes, or preservatives  pregnant or trying to become pregnant  breast-feeding How should I use this medicine? Take this medicine by mouth with a glass of water. Follow the directions on the prescription label. You can take it with or without food. Take your medicine at regular intervals. Do not take your medicine more often than directed. Do not stop taking this medicine suddenly except upon the advice of your doctor. Stopping this medicine too quickly may cause serious side effects or your condition may worsen. A special MedGuide will be given to you by the pharmacist with each prescription and refill. Be sure to read this information carefully each time. Talk to your pediatrician regarding the use of this medicine in children. Special care may be needed.  Patients over 44 years old may have a stronger reaction and need a smaller dose. Overdosage: If you think you have taken too much of this medicine contact a poison control center or emergency room at once. NOTE: This medicine is only for you. Do not share this medicine with others. What if I miss a dose? If you miss a dose, take it as soon as you can. If it is almost time for your next dose, take only that dose. Do not take double or extra doses. What may interact with this medicine? Do not take this medicine with any of the following medications:  certain medicines for fungal infections like fluconazole, itraconazole, ketoconazole, posaconazole, voriconazole  cisapride  dronedarone  escitalopram  linezolid  MAOIs like Carbex, Eldepryl, Marplan, Nardil, and Parnate  methylene blue (injected into a vein)  pimozide  thioridazine This medicine may also interact with the following medications:  alcohol  amphetamines  aspirin and aspirin-like medicines  carbamazepine  certain medicines for depression, anxiety, or psychotic disturbances  certain medicines for infections like chloroquine, clarithromycin, erythromycin, furazolidone, isoniazid, pentamidine  certain medicines for migraine headaches like almotriptan, eletriptan, frovatriptan, naratriptan, rizatriptan, sumatriptan, zolmitriptan  certain medicines for sleep  certain medicines that treat or prevent blood clots like dalteparin, enoxaparin, warfarin  cimetidine  diuretics  dofetilide  fentanyl  lithium  methadone  metoprolol  NSAIDs, medicines for pain and inflammation, like ibuprofen or naproxen  omeprazole  other medicines that prolong the QT interval (cause an abnormal heart rhythm)  procarbazine  rasagiline  supplements like St. John's wort, kava kava, valerian  tramadol  tryptophan  ziprasidone This list may not  describe all possible interactions. Give your health care provider a list  of all the medicines, herbs, non-prescription drugs, or dietary supplements you use. Also tell them if you smoke, drink alcohol, or use illegal drugs. Some items may interact with your medicine. What should I watch for while using this medicine? Tell your doctor if your symptoms do not get better or if they get worse. Visit your doctor or health care professional for regular checks on your progress. Because it may take several weeks to see the full effects of this medicine, it is important to continue your treatment as prescribed by your doctor. Patients and their families should watch out for new or worsening thoughts of suicide or depression. Also watch out for sudden changes in feelings such as feeling anxious, agitated, panicky, irritable, hostile, aggressive, impulsive, severely restless, overly excited and hyperactive, or not being able to sleep. If this happens, especially at the beginning of treatment or after a change in dose, call your health care professional. Bonita Quin may get drowsy or dizzy. Do not drive, use machinery, or do anything that needs mental alertness until you know how this medicine affects you. Do not stand or sit up quickly, especially if you are an older patient. This reduces the risk of dizzy or fainting spells. Alcohol may interfere with the effect of this medicine. Avoid alcoholic drinks. Your mouth may get dry. Chewing sugarless gum or sucking hard candy, and drinking plenty of water will help. Contact your doctor if the problem does not go away or is severe. What side effects may I notice from receiving this medicine? Side effects that you should report to your doctor or health care professional as soon as possible:  allergic reactions like skin rash, itching or hives, swelling of the face, lips, or tongue  anxious  black, tarry stools  breathing problems  changes in vision  chest pain  confusion  elevated mood, decreased need for sleep, racing thoughts, impulsive  behavior  eye pain  fast, irregular heartbeat  feeling faint or lightheaded, falls  feeling agitated, angry, or irritable  hallucination, loss of contact with reality  loss of balance or coordination  loss of memory  painful or prolonged erections  restlessness, pacing, inability to keep still  seizures  stiff muscles  suicidal thoughts or other mood changes  trouble sleeping  unusual bleeding or bruising  unusually weak or tired  vomiting Side effects that usually do not require medical attention (report to your doctor or health care professional if they continue or are bothersome):  change in appetite or weight  change in sex drive or performance  dizziness  headache  increased sweating  indigestion, nausea  tremors This list may not describe all possible side effects. Call your doctor for medical advice about side effects. You may report side effects to FDA at 1-800-FDA-1088. Where should I keep my medicine? Keep out of reach of children. Store at room temperature between 15 and 30 degrees C (59 and 86 degrees F). Throw away any unused medicine after the expiration date. NOTE: This sheet is a summary. It may not cover all possible information. If you have questions about this medicine, talk to your doctor, pharmacist, or health care provider.  2020 Elsevier/Gold Standard (2018-01-11 09:05:36)    Frontotemporal Dementia  Frontotemporal dementia (FTD) is a rare, progressive brain disorder that causes memory loss. FTD describes a range of diseases that often start with changes in behavior, speech, and thinking. As FTD progresses, it affects short-term  memory. Over time, FTD causes the frontal and temporal anterior lobes of the brain to shrink. These are the parts of the brain that control behavior and speech. There are three main types of FTD:  Behavioral variant FTD. This is the most common type and was previously called Pick disease.  Progressive  agrammatic aphasia.  Semantic variant primary progressive aphasia. FTD is one of the most common causes of progressive memory loss in people younger than 60 years. The disease progresses faster in some people than in others. In some families, FTD can be associated with amyotrophic lateral sclerosis (ALS). There is no cure for FTD, but treatment and supportive care can improve a person's quality of life. What are the causes? The exact cause of this condition is not known, although many genetic mutations are known to cause the disease. What increases the risk? This condition is more likely to occur in people who have a family history of FTD. Family members of people with FTD should think about genetic counseling. What are the signs or symptoms? Signs and symptoms of FTD usually start between the ages of 55-65 years. Symptoms may include:  Impulsive, poorly controlled, inappropriate, or embarrassing behavior.  Lack of motivation and interest (apathy).  Irritability and agitation.  Neglect of personal hygiene.  Withdrawal.  Inappropriate crying or laughing (pseudobulbar affect).  Repetitive behaviors.  Impulsive eating  Lack of concern for others.  Failure to recognize behavioral changes.  Loss of the ability to speak fluently.  Inability to write or read.  Slurred speech.  Loss of vocabulary.  New drug or alcohol abuse. Short-term memory loss is also a symptom later in the disease. How is this diagnosed? Your health care provider may suspect FTD if you have worsening behavior or speech difficulties. You may need to see specialists in brain and behavioral health who will collect your medical history and do a neurological examination. The following tests may be done:  Blood tests to rule out other causes, like vitamin deficiency, harmful effects of substances (toxicities), and infections.  Spinal tap (lumbar puncture) to check spinal fluid samples for abnormal proteins.   Imaging tests, such as a CT scan, PET scan, or MRI. These can show brain changes that suggest FTD or another brain disorder.  Memory testing (neuropsychological testing), which involves several hours of standardized tests to check the many functions of the brain. How is this treated? There is no cure for this condition and the progression of FTD cannot be stopped. Support at home is the most important aspect of managing FTD. Ask about caregiving resources in your community. Management of this condition may include:  Antidepressants to help with apathy.  Medicines to treat pseudobulbar affect.  Sedative medicines to control aggressive or dangerous behavior.  Speech and language therapy.  Behavioral therapy.  Occupational therapy to help with home safety and activity. Institutional or supportive care may eventually be needed. Follow these instructions at home: Eating and drinking  Follow a healthy diet. Eat foods that are high in fiber, such as fresh fruits and vegetables, whole grains, and beans.  Avoid having too much sugar and caffeine in your diet.  Watch for signs of compulsive eating, which can lead to other health problems.  Do not drink alcohol.  Drink enough fluid to keep your urine pale yellow. Lifestyle  Keep a regular routine.  Avoid stress and new situations.  Avoid any activities that may trigger aggressive behavior.  Schedule regular, enjoyable, and supervised physical activity. General instructions  Take over-the-counter and prescription medicines only as told by your health care provider.  If you were given a bracelet that tracks your location, make sure you wear it.  Work with your health care provider to determine what you need help with and what your safety needs are.  Keep all follow-up visits, including any therapy visits, as told by your health care provider. This is important. Contact a health care provider if:  Your symptoms change or get  worse.  It becomes more difficult or stressful to be cared for at home. Get help right away if:  It is no longer possible to be cared for at home.  You or your family members become concerned for your safety. Summary  Frontotemporal dementia (FTD) is a rare, progressive brain disorder that causes memory loss.  There is no cure for this condition and the progression of FTD cannot be stopped.  Support at home is the most important aspect of managing FTD. Ask about caregiving resources in your community.  Work with your health care provider to determine what you need help with and what your safety needs are. This information is not intended to replace advice given to you by your health care provider. Make sure you discuss any questions you have with your health care provider. Document Revised: 05/13/2018 Document Reviewed: 04/15/2016 Elsevier Patient Education  2020 [employer].

## 2019-05-24 ENCOUNTER — Encounter: Payer: Self-pay | Admitting: Clinical

## 2019-05-30 ENCOUNTER — Institutional Professional Consult (permissible substitution): Payer: PPO | Admitting: Clinical

## 2019-05-30 LAB — COMPREHENSIVE METABOLIC PANEL
ALT: 30 IU/L (ref 0–32)
AST: 38 IU/L (ref 0–40)
Albumin/Globulin Ratio: 1.5 (ref 1.2–2.2)
Albumin: 4.4 g/dL (ref 3.7–4.7)
Alkaline Phosphatase: 98 IU/L (ref 39–117)
BUN/Creatinine Ratio: 29 — ABNORMAL HIGH (ref 12–28)
BUN: 32 mg/dL — ABNORMAL HIGH (ref 8–27)
Bilirubin Total: <0.2 mg/dL (ref 0.0–1.2)
CO2: 26 mmol/L (ref 20–29)
Calcium: 9.4 mg/dL (ref 8.7–10.3)
Chloride: 101 mmol/L (ref 96–106)
Creatinine, Ser: 1.1 mg/dL — ABNORMAL HIGH (ref 0.57–1.00)
GFR calc Af Amer: 58 mL/min/{1.73_m2} — ABNORMAL LOW (ref >59)
GFR calc non Af Amer: 51 mL/min/{1.73_m2} — ABNORMAL LOW (ref >59)
Globulin, Total: 2.9 g/dL (ref 1.5–4.5)
Glucose: 89 mg/dL (ref 65–99)
Potassium: 4.5 mmol/L (ref 3.5–5.2)
Sodium: 140 mmol/L (ref 134–144)
Total Protein: 7.3 g/dL (ref 6.0–8.5)

## 2019-05-30 LAB — CBC
Hematocrit: 41.8 % (ref 34.0–46.6)
Hemoglobin: 14 g/dL (ref 11.1–15.9)
MCH: 30.6 pg (ref 26.6–33.0)
MCHC: 33.5 g/dL (ref 31.5–35.7)
MCV: 91 fL (ref 79–97)
Platelets: 237 10*3/uL (ref 150–450)
RBC: 4.58 x10E6/uL (ref 3.77–5.28)
RDW: 13.6 % (ref 11.7–15.4)
WBC: 5.1 10*3/uL (ref 3.4–10.8)

## 2019-05-30 LAB — VITAMIN B1: Thiamine: 196.9 nmol/L (ref 66.5–200.0)

## 2019-05-30 LAB — TSH: TSH: 40 u[IU]/mL — ABNORMAL HIGH (ref 0.450–4.500)

## 2019-05-30 LAB — HOMOCYSTEINE: Homocysteine: 10.7 umol/L (ref 0.0–19.2)

## 2019-05-30 LAB — METHYLMALONIC ACID, SERUM: Methylmalonic Acid: 231 nmol/L (ref 0–378)

## 2019-05-30 LAB — B12 AND FOLATE PANEL
Folate: >20 ng/mL (ref >3.0)
Vitamin B-12: 1988 pg/mL — ABNORMAL HIGH (ref 232–1245)

## 2019-05-31 ENCOUNTER — Institutional Professional Consult (permissible substitution): Payer: PPO | Admitting: Clinical

## 2019-06-24 DIAGNOSIS — E559 Vitamin D deficiency, unspecified: Secondary | ICD-10-CM | POA: Diagnosis not present

## 2019-06-24 DIAGNOSIS — E538 Deficiency of other specified B group vitamins: Secondary | ICD-10-CM | POA: Diagnosis not present

## 2019-06-24 DIAGNOSIS — E7801 Familial hypercholesterolemia: Secondary | ICD-10-CM | POA: Diagnosis not present

## 2019-06-24 DIAGNOSIS — Z0000 Encounter for general adult medical examination without abnormal findings: Secondary | ICD-10-CM | POA: Diagnosis not present

## 2019-06-24 DIAGNOSIS — E038 Other specified hypothyroidism: Secondary | ICD-10-CM | POA: Diagnosis not present

## 2019-06-30 DIAGNOSIS — H11003 Unspecified pterygium of eye, bilateral: Secondary | ICD-10-CM | POA: Diagnosis not present

## 2019-06-30 DIAGNOSIS — H25813 Combined forms of age-related cataract, bilateral: Secondary | ICD-10-CM | POA: Diagnosis not present

## 2019-06-30 DIAGNOSIS — H52203 Unspecified astigmatism, bilateral: Secondary | ICD-10-CM | POA: Diagnosis not present

## 2019-06-30 DIAGNOSIS — H43813 Vitreous degeneration, bilateral: Secondary | ICD-10-CM | POA: Diagnosis not present

## 2019-07-01 DIAGNOSIS — F0390 Unspecified dementia without behavioral disturbance: Secondary | ICD-10-CM | POA: Diagnosis not present

## 2019-07-01 DIAGNOSIS — E038 Other specified hypothyroidism: Secondary | ICD-10-CM | POA: Diagnosis not present

## 2019-07-01 DIAGNOSIS — E7801 Familial hypercholesterolemia: Secondary | ICD-10-CM | POA: Diagnosis not present

## 2019-07-01 DIAGNOSIS — R7301 Impaired fasting glucose: Secondary | ICD-10-CM | POA: Diagnosis not present

## 2019-07-01 DIAGNOSIS — M810 Age-related osteoporosis without current pathological fracture: Secondary | ICD-10-CM | POA: Diagnosis not present

## 2019-07-01 DIAGNOSIS — R82998 Other abnormal findings in urine: Secondary | ICD-10-CM | POA: Diagnosis not present

## 2019-07-01 DIAGNOSIS — N1831 Chronic kidney disease, stage 3a: Secondary | ICD-10-CM | POA: Diagnosis not present

## 2019-07-01 DIAGNOSIS — E46 Unspecified protein-calorie malnutrition: Secondary | ICD-10-CM | POA: Diagnosis not present

## 2019-07-01 DIAGNOSIS — E538 Deficiency of other specified B group vitamins: Secondary | ICD-10-CM | POA: Diagnosis not present

## 2019-07-01 DIAGNOSIS — Z0000 Encounter for general adult medical examination without abnormal findings: Secondary | ICD-10-CM | POA: Diagnosis not present

## 2019-07-26 ENCOUNTER — Ambulatory Visit (INDEPENDENT_AMBULATORY_CARE_PROVIDER_SITE_OTHER): Payer: Medicare HMO | Admitting: Psychology

## 2019-07-26 DIAGNOSIS — F0280 Dementia in other diseases classified elsewhere without behavioral disturbance: Secondary | ICD-10-CM | POA: Diagnosis not present

## 2019-07-26 DIAGNOSIS — F0631 Mood disorder due to known physiological condition with depressive features: Secondary | ICD-10-CM | POA: Diagnosis not present

## 2019-07-26 DIAGNOSIS — F0630 Mood disorder due to known physiological condition, unspecified: Secondary | ICD-10-CM | POA: Diagnosis not present

## 2019-07-26 DIAGNOSIS — G3109 Other frontotemporal dementia: Secondary | ICD-10-CM | POA: Diagnosis not present

## 2019-08-03 DIAGNOSIS — Z1231 Encounter for screening mammogram for malignant neoplasm of breast: Secondary | ICD-10-CM | POA: Diagnosis not present

## 2019-08-04 DIAGNOSIS — Z111 Encounter for screening for respiratory tuberculosis: Secondary | ICD-10-CM | POA: Diagnosis not present

## 2019-08-10 DIAGNOSIS — E039 Hypothyroidism, unspecified: Secondary | ICD-10-CM | POA: Diagnosis not present

## 2019-08-10 DIAGNOSIS — R7301 Impaired fasting glucose: Secondary | ICD-10-CM | POA: Diagnosis not present

## 2019-08-10 DIAGNOSIS — F0391 Unspecified dementia with behavioral disturbance: Secondary | ICD-10-CM | POA: Diagnosis not present

## 2019-08-10 DIAGNOSIS — M810 Age-related osteoporosis without current pathological fracture: Secondary | ICD-10-CM | POA: Diagnosis not present

## 2019-08-10 DIAGNOSIS — E782 Mixed hyperlipidemia: Secondary | ICD-10-CM | POA: Diagnosis not present

## 2019-08-10 DIAGNOSIS — R030 Elevated blood-pressure reading, without diagnosis of hypertension: Secondary | ICD-10-CM | POA: Diagnosis not present

## 2019-08-11 DIAGNOSIS — L821 Other seborrheic keratosis: Secondary | ICD-10-CM | POA: Diagnosis not present

## 2019-08-11 DIAGNOSIS — L720 Epidermal cyst: Secondary | ICD-10-CM | POA: Diagnosis not present

## 2019-08-17 DIAGNOSIS — E782 Mixed hyperlipidemia: Secondary | ICD-10-CM | POA: Diagnosis not present

## 2019-08-17 DIAGNOSIS — J309 Allergic rhinitis, unspecified: Secondary | ICD-10-CM | POA: Diagnosis not present

## 2019-08-17 DIAGNOSIS — R7301 Impaired fasting glucose: Secondary | ICD-10-CM | POA: Diagnosis not present

## 2019-08-17 DIAGNOSIS — M810 Age-related osteoporosis without current pathological fracture: Secondary | ICD-10-CM | POA: Diagnosis not present

## 2019-08-17 DIAGNOSIS — E559 Vitamin D deficiency, unspecified: Secondary | ICD-10-CM | POA: Diagnosis not present

## 2019-08-17 DIAGNOSIS — Z780 Asymptomatic menopausal state: Secondary | ICD-10-CM | POA: Diagnosis not present

## 2019-08-17 DIAGNOSIS — E890 Postprocedural hypothyroidism: Secondary | ICD-10-CM | POA: Diagnosis not present

## 2019-08-17 DIAGNOSIS — K219 Gastro-esophageal reflux disease without esophagitis: Secondary | ICD-10-CM | POA: Diagnosis not present

## 2019-08-17 DIAGNOSIS — R030 Elevated blood-pressure reading, without diagnosis of hypertension: Secondary | ICD-10-CM | POA: Diagnosis not present

## 2019-08-23 DIAGNOSIS — R928 Other abnormal and inconclusive findings on diagnostic imaging of breast: Secondary | ICD-10-CM | POA: Diagnosis not present

## 2019-08-23 DIAGNOSIS — N6489 Other specified disorders of breast: Secondary | ICD-10-CM | POA: Diagnosis not present

## 2019-09-08 ENCOUNTER — Ambulatory Visit: Payer: Medicare HMO | Admitting: Psychology

## 2019-11-23 ENCOUNTER — Encounter: Payer: Self-pay | Admitting: Adult Health

## 2019-11-23 ENCOUNTER — Ambulatory Visit: Payer: Medicare HMO | Admitting: Adult Health

## 2019-11-23 ENCOUNTER — Ambulatory Visit: Payer: PPO | Admitting: Neurology

## 2019-11-23 ENCOUNTER — Other Ambulatory Visit: Payer: Self-pay

## 2019-11-23 VITALS — BP 108/72 | HR 64 | Ht 64.5 in | Wt 106.0 lb

## 2019-11-23 DIAGNOSIS — F0281 Dementia in other diseases classified elsewhere with behavioral disturbance: Secondary | ICD-10-CM

## 2019-11-23 DIAGNOSIS — G3109 Other frontotemporal dementia: Secondary | ICD-10-CM
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
## 2019-11-23 NOTE — Patient Instructions (Signed)
Your Plan:  Continue to monitor symptoms If your symptoms worsen or you develop new symptoms please let us know.   Thank you for coming to see us at [facility]. I hope we have been able to provide you high quality care today.  You may receive a patient satisfaction survey over the next few weeks. We would appreciate your feedback and comments so that we may continue to improve ourselves and the health of our patients.   

## 2019-11-24 DIAGNOSIS — R928 Other abnormal and inconclusive findings on diagnostic imaging of breast: Secondary | ICD-10-CM | POA: Diagnosis not present

## 2019-11-29 DIAGNOSIS — E559 Vitamin D deficiency, unspecified: Secondary | ICD-10-CM | POA: Diagnosis not present

## 2019-11-29 DIAGNOSIS — E538 Deficiency of other specified B group vitamins: Secondary | ICD-10-CM | POA: Diagnosis not present

## 2019-11-29 DIAGNOSIS — N1831 Chronic kidney disease, stage 3a: Secondary | ICD-10-CM | POA: Diagnosis not present

## 2019-11-29 DIAGNOSIS — E7801 Familial hypercholesterolemia: Secondary | ICD-10-CM | POA: Diagnosis not present

## 2019-11-29 DIAGNOSIS — E46 Unspecified protein-calorie malnutrition: Secondary | ICD-10-CM | POA: Diagnosis not present

## 2019-11-29 DIAGNOSIS — E039 Hypothyroidism, unspecified: Secondary | ICD-10-CM | POA: Diagnosis not present

## 2019-11-29 DIAGNOSIS — R350 Frequency of micturition: Secondary | ICD-10-CM | POA: Diagnosis not present

## 2019-12-12 DIAGNOSIS — E782 Mixed hyperlipidemia: Secondary | ICD-10-CM | POA: Diagnosis not present

## 2019-12-12 DIAGNOSIS — E039 Hypothyroidism, unspecified: Secondary | ICD-10-CM | POA: Diagnosis not present

## 2019-12-12 DIAGNOSIS — R7301 Impaired fasting glucose: Secondary | ICD-10-CM | POA: Diagnosis not present

## 2019-12-21 DIAGNOSIS — E559 Vitamin D deficiency, unspecified: Secondary | ICD-10-CM | POA: Diagnosis not present

## 2019-12-21 DIAGNOSIS — G3109 Other frontotemporal dementia: Secondary | ICD-10-CM | POA: Diagnosis not present

## 2019-12-21 DIAGNOSIS — R7301 Impaired fasting glucose: Secondary | ICD-10-CM | POA: Diagnosis not present

## 2019-12-21 DIAGNOSIS — M810 Age-related osteoporosis without current pathological fracture: Secondary | ICD-10-CM | POA: Diagnosis not present

## 2019-12-21 DIAGNOSIS — E782 Mixed hyperlipidemia: Secondary | ICD-10-CM | POA: Diagnosis not present

## 2019-12-21 DIAGNOSIS — H9319 Tinnitus, unspecified ear: Secondary | ICD-10-CM | POA: Diagnosis not present

## 2019-12-21 DIAGNOSIS — K219 Gastro-esophageal reflux disease without esophagitis: Secondary | ICD-10-CM | POA: Diagnosis not present

## 2019-12-21 DIAGNOSIS — E039 Hypothyroidism, unspecified: Secondary | ICD-10-CM | POA: Diagnosis not present

## 2019-12-21 DIAGNOSIS — R030 Elevated blood-pressure reading, without diagnosis of hypertension: Secondary | ICD-10-CM | POA: Diagnosis not present

## 2020-03-05 DIAGNOSIS — F0390 Unspecified dementia without behavioral disturbance: Secondary | ICD-10-CM | POA: Diagnosis not present

## 2020-03-05 DIAGNOSIS — S0180XA Unspecified open wound of other part of head, initial encounter: Secondary | ICD-10-CM | POA: Diagnosis not present

## 2020-03-07 ENCOUNTER — Telehealth: Payer: Self-pay | Admitting: Adult Health
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
## 2020-03-07 NOTE — Telephone Encounter (Signed)
Ok to try 

## 2020-03-07 NOTE — Telephone Encounter (Signed)
Pt's husband, Asheley Hellberg (on [place]) called, Pt currently in memory care. She has trouble sleeping, becoming aggressive, lack of patience.  Dr. Thornell Mule thinking of putting her on Depakote.He wanted to check with her physician to see if that would be appropriate. Would like a call from the nurse.

## 2020-03-08 NOTE — Telephone Encounter (Signed)
Kaitlyn Bryant form [employer] called to discuss patient's behavioral issues. She states that husband has contacted them as well. She asks for a call back & to ask to speak directly with her. To make sure to tell the receptionist do not send to her vm.  Best contact: [phone]

## 2020-03-08 NOTE — Telephone Encounter (Signed)
I called Kaitlyn Bryant back and had to LM.  Are we able to prescribe something for pt?

## 2020-03-08 NOTE — Telephone Encounter (Signed)
Spoke to [place].  I relayed did send message to Dr. Thornell Mule yesterday concerning megan's message ok to try. She stated that would be low dose.  She will let him know.

## 2020-03-10 NOTE — Telephone Encounter (Signed)
A few days ago, I had written for her to try depakote 250mg  bid and see how it goes. Thank you for everyone's help, 

## 2020-04-09 DIAGNOSIS — Z20828 Contact with and (suspected) exposure to other viral communicable diseases: Secondary | ICD-10-CM | POA: Diagnosis not present

## 2020-04-16 DIAGNOSIS — Z20828 Contact with and (suspected) exposure to other viral communicable diseases: Secondary | ICD-10-CM | POA: Diagnosis not present

## 2020-04-17 DIAGNOSIS — M810 Age-related osteoporosis without current pathological fracture: Secondary | ICD-10-CM | POA: Diagnosis not present

## 2020-04-17 DIAGNOSIS — R7301 Impaired fasting glucose: Secondary | ICD-10-CM | POA: Diagnosis not present

## 2020-04-17 DIAGNOSIS — E782 Mixed hyperlipidemia: Secondary | ICD-10-CM | POA: Diagnosis not present

## 2020-04-17 DIAGNOSIS — E039 Hypothyroidism, unspecified: Secondary | ICD-10-CM | POA: Diagnosis not present

## 2020-04-20 DIAGNOSIS — G3109 Other frontotemporal dementia: Secondary | ICD-10-CM | POA: Diagnosis not present

## 2020-04-20 DIAGNOSIS — M810 Age-related osteoporosis without current pathological fracture: Secondary | ICD-10-CM | POA: Diagnosis not present

## 2020-04-20 DIAGNOSIS — R030 Elevated blood-pressure reading, without diagnosis of hypertension: Secondary | ICD-10-CM | POA: Diagnosis not present

## 2020-04-20 DIAGNOSIS — E559 Vitamin D deficiency, unspecified: Secondary | ICD-10-CM | POA: Diagnosis not present

## 2020-04-20 DIAGNOSIS — H9319 Tinnitus, unspecified ear: Secondary | ICD-10-CM | POA: Diagnosis not present

## 2020-04-20 DIAGNOSIS — R7301 Impaired fasting glucose: Secondary | ICD-10-CM | POA: Diagnosis not present

## 2020-04-20 DIAGNOSIS — K219 Gastro-esophageal reflux disease without esophagitis: Secondary | ICD-10-CM | POA: Diagnosis not present

## 2020-04-20 DIAGNOSIS — E039 Hypothyroidism, unspecified: Secondary | ICD-10-CM | POA: Diagnosis not present

## 2020-04-20 DIAGNOSIS — E782 Mixed hyperlipidemia: Secondary | ICD-10-CM | POA: Diagnosis not present

## 2020-04-23 DIAGNOSIS — Z20828 Contact with and (suspected) exposure to other viral communicable diseases: Secondary | ICD-10-CM | POA: Diagnosis not present

## 2020-04-30 DIAGNOSIS — Z20828 Contact with and (suspected) exposure to other viral communicable diseases: Secondary | ICD-10-CM | POA: Diagnosis not present

## 2020-05-07 DIAGNOSIS — Z20828 Contact with and (suspected) exposure to other viral communicable diseases: Secondary | ICD-10-CM | POA: Diagnosis not present

## 2020-05-14 DIAGNOSIS — Z20828 Contact with and (suspected) exposure to other viral communicable diseases: Secondary | ICD-10-CM | POA: Diagnosis not present

## 2020-05-17 DIAGNOSIS — F0391 Unspecified dementia with behavioral disturbance: Secondary | ICD-10-CM | POA: Diagnosis not present

## 2020-05-17 DIAGNOSIS — R0609 Other forms of dyspnea: Secondary | ICD-10-CM | POA: Diagnosis not present

## 2020-05-17 DIAGNOSIS — R350 Frequency of micturition: Secondary | ICD-10-CM | POA: Diagnosis not present

## 2020-05-17 DIAGNOSIS — N3001 Acute cystitis with hematuria: Secondary | ICD-10-CM | POA: Diagnosis not present

## 2020-05-17 DIAGNOSIS — E039 Hypothyroidism, unspecified: Secondary | ICD-10-CM | POA: Diagnosis not present

## 2020-05-17 DIAGNOSIS — R413 Other amnesia: Secondary | ICD-10-CM | POA: Diagnosis not present

## 2020-05-17 DIAGNOSIS — R140 Abdominal distension (gaseous): Secondary | ICD-10-CM | POA: Diagnosis not present

## 2020-05-21 DIAGNOSIS — Z20828 Contact with and (suspected) exposure to other viral communicable diseases: Secondary | ICD-10-CM | POA: Diagnosis not present

## 2020-05-28 DIAGNOSIS — Z20828 Contact with and (suspected) exposure to other viral communicable diseases: Secondary | ICD-10-CM | POA: Diagnosis not present

## 2020-06-04 DIAGNOSIS — Z20828 Contact with and (suspected) exposure to other viral communicable diseases: Secondary | ICD-10-CM | POA: Diagnosis not present

## 2020-06-11 DIAGNOSIS — H25813 Combined forms of age-related cataract, bilateral: Secondary | ICD-10-CM | POA: Diagnosis not present

## 2020-06-11 DIAGNOSIS — H52203 Unspecified astigmatism, bilateral: Secondary | ICD-10-CM | POA: Diagnosis not present

## 2020-06-11 DIAGNOSIS — Z20828 Contact with and (suspected) exposure to other viral communicable diseases: Secondary | ICD-10-CM | POA: Diagnosis not present

## 2020-06-11 DIAGNOSIS — H43813 Vitreous degeneration, bilateral: Secondary | ICD-10-CM | POA: Diagnosis not present

## 2020-06-11 DIAGNOSIS — H11003 Unspecified pterygium of eye, bilateral: Secondary | ICD-10-CM | POA: Diagnosis not present

## 2020-06-18 DIAGNOSIS — Z20828 Contact with and (suspected) exposure to other viral communicable diseases: Secondary | ICD-10-CM | POA: Diagnosis not present

## 2020-06-25 DIAGNOSIS — Z20828 Contact with and (suspected) exposure to other viral communicable diseases: Secondary | ICD-10-CM | POA: Diagnosis not present

## 2020-06-28 DIAGNOSIS — F0281 Dementia in other diseases classified elsewhere with behavioral disturbance: Secondary | ICD-10-CM | POA: Diagnosis not present

## 2020-06-28 DIAGNOSIS — G3109 Other frontotemporal dementia: Secondary | ICD-10-CM | POA: Diagnosis not present

## 2020-07-06 DIAGNOSIS — G3109 Other frontotemporal dementia: Secondary | ICD-10-CM | POA: Diagnosis not present

## 2020-07-06 DIAGNOSIS — R4587 Impulsiveness: Secondary | ICD-10-CM | POA: Diagnosis not present

## 2020-07-06 DIAGNOSIS — F419 Anxiety disorder, unspecified: Secondary | ICD-10-CM | POA: Diagnosis not present

## 2020-07-11 DIAGNOSIS — F0281 Dementia in other diseases classified elsewhere with behavioral disturbance: Secondary | ICD-10-CM | POA: Diagnosis not present

## 2020-07-11 DIAGNOSIS — G3109 Other frontotemporal dementia: Secondary | ICD-10-CM | POA: Diagnosis not present

## 2020-07-23 DIAGNOSIS — G3109 Other frontotemporal dementia: Secondary | ICD-10-CM | POA: Diagnosis not present

## 2020-07-23 DIAGNOSIS — R4587 Impulsiveness: Secondary | ICD-10-CM | POA: Diagnosis not present

## 2020-07-26 DIAGNOSIS — R4587 Impulsiveness: Secondary | ICD-10-CM | POA: Diagnosis not present

## 2020-07-26 DIAGNOSIS — F419 Anxiety disorder, unspecified: Secondary | ICD-10-CM | POA: Diagnosis not present

## 2020-07-26 DIAGNOSIS — R4689 Other symptoms and signs involving appearance and behavior: Secondary | ICD-10-CM | POA: Diagnosis not present

## 2020-07-26 DIAGNOSIS — G3109 Other frontotemporal dementia: Secondary | ICD-10-CM | POA: Diagnosis not present

## 2020-07-27 DIAGNOSIS — E559 Vitamin D deficiency, unspecified: Secondary | ICD-10-CM | POA: Diagnosis not present

## 2020-07-27 DIAGNOSIS — E782 Mixed hyperlipidemia: Secondary | ICD-10-CM | POA: Diagnosis not present

## 2020-07-27 DIAGNOSIS — E039 Hypothyroidism, unspecified: Secondary | ICD-10-CM | POA: Diagnosis not present

## 2020-07-27 DIAGNOSIS — R7301 Impaired fasting glucose: Secondary | ICD-10-CM | POA: Diagnosis not present

## 2020-07-31 DIAGNOSIS — E039 Hypothyroidism, unspecified: Secondary | ICD-10-CM | POA: Diagnosis not present

## 2020-07-31 DIAGNOSIS — E559 Vitamin D deficiency, unspecified: Secondary | ICD-10-CM | POA: Diagnosis not present

## 2020-07-31 DIAGNOSIS — E782 Mixed hyperlipidemia: Secondary | ICD-10-CM | POA: Diagnosis not present

## 2020-07-31 DIAGNOSIS — M810 Age-related osteoporosis without current pathological fracture: Secondary | ICD-10-CM | POA: Diagnosis not present

## 2020-07-31 DIAGNOSIS — R030 Elevated blood-pressure reading, without diagnosis of hypertension: Secondary | ICD-10-CM | POA: Diagnosis not present

## 2020-07-31 DIAGNOSIS — K219 Gastro-esophageal reflux disease without esophagitis: Secondary | ICD-10-CM | POA: Diagnosis not present

## 2020-07-31 DIAGNOSIS — G3109 Other frontotemporal dementia: Secondary | ICD-10-CM | POA: Diagnosis not present

## 2020-07-31 DIAGNOSIS — R7301 Impaired fasting glucose: Secondary | ICD-10-CM | POA: Diagnosis not present

## 2020-07-31 DIAGNOSIS — H9319 Tinnitus, unspecified ear: Secondary | ICD-10-CM | POA: Diagnosis not present

## 2020-08-08 DIAGNOSIS — G3109 Other frontotemporal dementia: Secondary | ICD-10-CM | POA: Diagnosis not present

## 2020-08-08 DIAGNOSIS — F0281 Dementia in other diseases classified elsewhere with behavioral disturbance: Secondary | ICD-10-CM | POA: Diagnosis not present

## 2020-08-13 DIAGNOSIS — G3109 Other frontotemporal dementia: Secondary | ICD-10-CM | POA: Diagnosis not present

## 2020-08-13 DIAGNOSIS — R4587 Impulsiveness: Secondary | ICD-10-CM | POA: Diagnosis not present

## 2020-08-13 DIAGNOSIS — F0391 Unspecified dementia with behavioral disturbance: Secondary | ICD-10-CM | POA: Diagnosis not present

## 2020-08-13 DIAGNOSIS — R4689 Other symptoms and signs involving appearance and behavior: Secondary | ICD-10-CM | POA: Diagnosis not present

## 2020-08-14 DIAGNOSIS — D649 Anemia, unspecified: Secondary | ICD-10-CM | POA: Diagnosis not present

## 2020-08-14 DIAGNOSIS — N390 Urinary tract infection, site not specified: Secondary | ICD-10-CM | POA: Diagnosis not present

## 2020-08-14 DIAGNOSIS — I10 Essential (primary) hypertension: Secondary | ICD-10-CM | POA: Diagnosis not present

## 2020-08-16 DIAGNOSIS — G3109 Other frontotemporal dementia: Secondary | ICD-10-CM | POA: Diagnosis not present

## 2020-08-16 DIAGNOSIS — R4689 Other symptoms and signs involving appearance and behavior: Secondary | ICD-10-CM | POA: Diagnosis not present

## 2020-08-16 DIAGNOSIS — R4587 Impulsiveness: Secondary | ICD-10-CM | POA: Diagnosis not present

## 2020-08-22 DIAGNOSIS — G3109 Other frontotemporal dementia: Secondary | ICD-10-CM | POA: Diagnosis not present

## 2020-08-22 DIAGNOSIS — F0281 Dementia in other diseases classified elsewhere with behavioral disturbance: Secondary | ICD-10-CM | POA: Diagnosis not present

## 2020-08-24 DIAGNOSIS — G3109 Other frontotemporal dementia: Secondary | ICD-10-CM | POA: Diagnosis not present

## 2020-08-24 DIAGNOSIS — R2681 Unsteadiness on feet: Secondary | ICD-10-CM | POA: Diagnosis not present

## 2020-08-24 DIAGNOSIS — F0391 Unspecified dementia with behavioral disturbance: Secondary | ICD-10-CM | POA: Diagnosis not present

## 2020-08-27 DIAGNOSIS — F0151 Vascular dementia with behavioral disturbance: Secondary | ICD-10-CM | POA: Diagnosis not present

## 2020-08-27 DIAGNOSIS — I10 Essential (primary) hypertension: Secondary | ICD-10-CM | POA: Diagnosis not present

## 2020-09-06 DIAGNOSIS — R140 Abdominal distension (gaseous): Secondary | ICD-10-CM | POA: Diagnosis not present

## 2020-09-10 DIAGNOSIS — F0391 Unspecified dementia with behavioral disturbance: Secondary | ICD-10-CM | POA: Diagnosis not present

## 2020-09-10 DIAGNOSIS — G3109 Other frontotemporal dementia: Secondary | ICD-10-CM | POA: Diagnosis not present

## 2020-09-10 DIAGNOSIS — R5383 Other fatigue: Secondary | ICD-10-CM | POA: Diagnosis not present

## 2020-09-11 DIAGNOSIS — I10 Essential (primary) hypertension: Secondary | ICD-10-CM | POA: Diagnosis not present

## 2020-09-11 DIAGNOSIS — F0151 Vascular dementia with behavioral disturbance: Secondary | ICD-10-CM | POA: Diagnosis not present

## 2020-09-11 DIAGNOSIS — D649 Anemia, unspecified: Secondary | ICD-10-CM | POA: Diagnosis not present

## 2020-09-12 DIAGNOSIS — R5383 Other fatigue: Secondary | ICD-10-CM | POA: Diagnosis not present

## 2020-09-12 DIAGNOSIS — G3109 Other frontotemporal dementia: Secondary | ICD-10-CM | POA: Diagnosis not present

## 2020-09-12 DIAGNOSIS — E875 Hyperkalemia: Secondary | ICD-10-CM | POA: Diagnosis not present

## 2020-09-13 DIAGNOSIS — F0151 Vascular dementia with behavioral disturbance: Secondary | ICD-10-CM | POA: Diagnosis not present

## 2020-09-17 DIAGNOSIS — R251 Tremor, unspecified: Secondary | ICD-10-CM | POA: Diagnosis not present

## 2020-09-17 DIAGNOSIS — R051 Acute cough: Secondary | ICD-10-CM | POA: Diagnosis not present

## 2020-09-17 DIAGNOSIS — F0280 Dementia in other diseases classified elsewhere without behavioral disturbance: Secondary | ICD-10-CM | POA: Diagnosis not present

## 2020-09-17 DIAGNOSIS — G3109 Other frontotemporal dementia: Secondary | ICD-10-CM | POA: Diagnosis not present

## 2020-09-17 DIAGNOSIS — Z20822 Contact with and (suspected) exposure to covid-19: Secondary | ICD-10-CM | POA: Diagnosis not present

## 2020-09-18 DIAGNOSIS — F0151 Vascular dementia with behavioral disturbance: Secondary | ICD-10-CM | POA: Diagnosis not present

## 2020-09-18 DIAGNOSIS — I10 Essential (primary) hypertension: Secondary | ICD-10-CM | POA: Diagnosis not present

## 2020-10-10 DIAGNOSIS — M47812 Spondylosis without myelopathy or radiculopathy, cervical region: Secondary | ICD-10-CM | POA: Diagnosis not present

## 2020-10-10 DIAGNOSIS — Z043 Encounter for examination and observation following other accident: Secondary | ICD-10-CM | POA: Diagnosis not present

## 2020-10-11 DIAGNOSIS — T148XXA Other injury of unspecified body region, initial encounter: Secondary | ICD-10-CM | POA: Diagnosis not present

## 2020-10-11 DIAGNOSIS — W19XXXA Unspecified fall, initial encounter: Secondary | ICD-10-CM | POA: Diagnosis not present

## 2020-10-11 DIAGNOSIS — G3109 Other frontotemporal dementia: Secondary | ICD-10-CM | POA: Diagnosis not present

## 2020-10-12 DIAGNOSIS — M5030 Other cervical disc degeneration, unspecified cervical region: Secondary | ICD-10-CM | POA: Diagnosis not present

## 2020-10-13 DIAGNOSIS — R531 Weakness: Secondary | ICD-10-CM | POA: Diagnosis not present

## 2020-10-15 DIAGNOSIS — F0281 Dementia in other diseases classified elsewhere with behavioral disturbance: Secondary | ICD-10-CM | POA: Diagnosis not present

## 2020-10-15 DIAGNOSIS — G3109 Other frontotemporal dementia: Secondary | ICD-10-CM | POA: Diagnosis not present

## 2020-10-16 DIAGNOSIS — G2401 Drug induced subacute dyskinesia: Secondary | ICD-10-CM | POA: Diagnosis not present

## 2020-10-16 DIAGNOSIS — G3109 Other frontotemporal dementia: Secondary | ICD-10-CM | POA: Diagnosis not present

## 2020-10-18 DIAGNOSIS — N390 Urinary tract infection, site not specified: Secondary | ICD-10-CM | POA: Diagnosis not present

## 2020-10-19 DIAGNOSIS — G3109 Other frontotemporal dementia: Secondary | ICD-10-CM | POA: Diagnosis not present

## 2020-10-19 DIAGNOSIS — B029 Zoster without complications: Secondary | ICD-10-CM | POA: Diagnosis not present

## 2020-11-01 DIAGNOSIS — B029 Zoster without complications: Secondary | ICD-10-CM | POA: Diagnosis not present

## 2020-11-01 DIAGNOSIS — G3109 Other frontotemporal dementia: Secondary | ICD-10-CM | POA: Diagnosis not present

## 2020-11-06 DIAGNOSIS — G2401 Drug induced subacute dyskinesia: Secondary | ICD-10-CM | POA: Diagnosis not present

## 2020-11-06 DIAGNOSIS — R531 Weakness: Secondary | ICD-10-CM | POA: Diagnosis not present

## 2020-11-06 DIAGNOSIS — G3109 Other frontotemporal dementia: Secondary | ICD-10-CM | POA: Diagnosis not present

## 2020-11-06 DIAGNOSIS — R251 Tremor, unspecified: Secondary | ICD-10-CM | POA: Diagnosis not present

## 2020-11-07 DIAGNOSIS — R531 Weakness: Secondary | ICD-10-CM | POA: Diagnosis not present

## 2020-11-07 DIAGNOSIS — D649 Anemia, unspecified: Secondary | ICD-10-CM | POA: Diagnosis not present

## 2020-11-07 DIAGNOSIS — A000 Cholera due to Vibrio cholerae 01, biovar cholerae: Secondary | ICD-10-CM | POA: Diagnosis not present

## 2020-11-07 DIAGNOSIS — I10 Essential (primary) hypertension: Secondary | ICD-10-CM | POA: Diagnosis not present

## 2020-11-07 DIAGNOSIS — E559 Vitamin D deficiency, unspecified: Secondary | ICD-10-CM | POA: Diagnosis not present

## 2020-11-12 DIAGNOSIS — F02C2 Dementia in other diseases classified elsewhere, severe, with psychotic disturbance: Secondary | ICD-10-CM | POA: Diagnosis not present

## 2020-11-12 DIAGNOSIS — E039 Hypothyroidism, unspecified: Secondary | ICD-10-CM | POA: Diagnosis not present

## 2020-11-12 DIAGNOSIS — G3109 Other frontotemporal dementia: Secondary | ICD-10-CM | POA: Diagnosis not present

## 2020-11-12 DIAGNOSIS — R7989 Other specified abnormal findings of blood chemistry: Secondary | ICD-10-CM | POA: Diagnosis not present

## 2020-11-12 DIAGNOSIS — F411 Generalized anxiety disorder: Secondary | ICD-10-CM | POA: Diagnosis not present

## 2020-11-16 DIAGNOSIS — Z79899 Other long term (current) drug therapy: Secondary | ICD-10-CM | POA: Diagnosis not present

## 2020-11-16 DIAGNOSIS — E569 Vitamin deficiency, unspecified: Secondary | ICD-10-CM | POA: Diagnosis not present

## 2020-11-22 ENCOUNTER — Telehealth: Payer: Self-pay | Admitting: *Deleted

## 2020-11-22 ENCOUNTER — Encounter: Payer: Self-pay | Admitting: Adult Health

## 2020-11-22 ENCOUNTER — Other Ambulatory Visit: Payer: Self-pay

## 2020-11-22 ENCOUNTER — Ambulatory Visit: Payer: Medicare HMO | Admitting: Adult Health

## 2020-11-22 VITALS — BP 127/72 | HR 95 | Ht 65.0 in | Wt 153.4 lb

## 2020-11-22 DIAGNOSIS — G3109 Other frontotemporal dementia: Secondary | ICD-10-CM

## 2020-11-22 DIAGNOSIS — F02818 Dementia in other diseases classified elsewhere, unspecified severity, with other behavioral disturbance: Secondary | ICD-10-CM
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
## 2020-11-22 NOTE — Telephone Encounter (Addendum)
I spoke with the director over at [facility] [phone].  I let her know per Aundra Millet NP that pt was evaluated today and parkinson's symptoms can be seen in [facility].  At this time provider feels that any further testing (I.e. imaging) would not be warranted or change the treatment plan.  Margaretha Glassing verbalized understanding and agrees.  She asked if patient should remain on her Cogentin. Pt also on Depakote, Zoloft, Seroquel. Per Aundra Millet NP, if they feel patient has improved on Cogentin, can remain on, if not, she can stop it. Margaretha Glassing verbalized understanding and will let the NP at [facility] know.

## 2020-12-10 DIAGNOSIS — F02C2 Dementia in other diseases classified elsewhere, severe, with psychotic disturbance: Secondary | ICD-10-CM | POA: Diagnosis not present

## 2020-12-10 DIAGNOSIS — F411 Generalized anxiety disorder: Secondary | ICD-10-CM | POA: Diagnosis not present

## 2020-12-10 DIAGNOSIS — G3109 Other frontotemporal dementia: Secondary | ICD-10-CM | POA: Diagnosis not present

## 2020-12-10 IMAGING — CT CT HEAD W/O CM
1 series · 16 of 30 positions shown, 20 images · non-contrast
Comparison: None.

CLINICAL DATA: Memory loss over the last year.

EXAM:
CT HEAD WITHOUT CONTRAST
TECHNIQUE: Contiguous axial images were obtained from the base of the skull
through the vertex without intravenous contrast.

[Series 2: head w/(date) · axial · 0.49mm/px · z∈[-96,+64]mm · 16 of 36 slices shown, 20 images]
[im 2/36  brain]
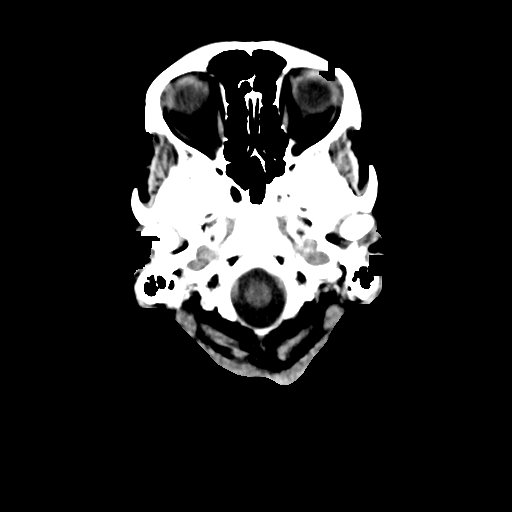
[im 2/36  bone]
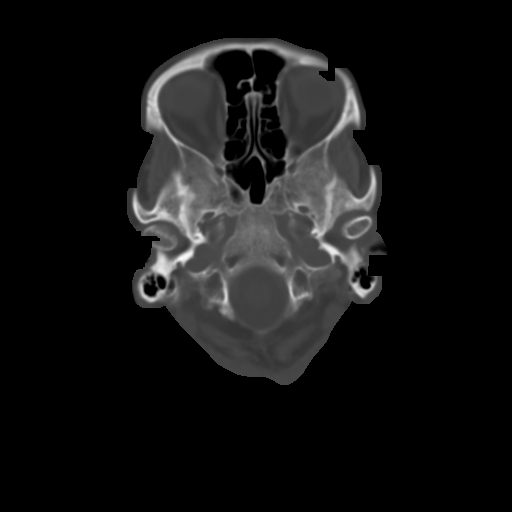
[im 4/36  brain]
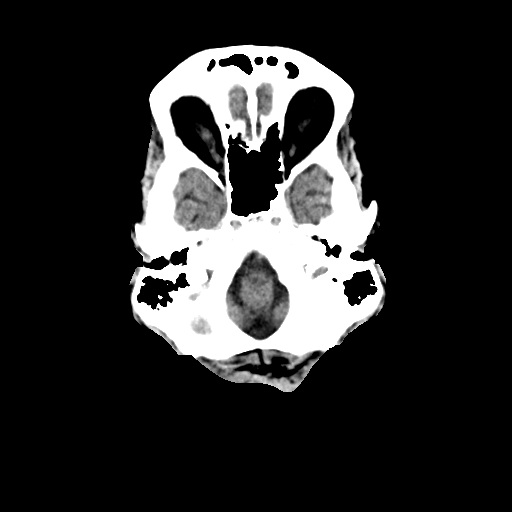
[im 7/36  brain]
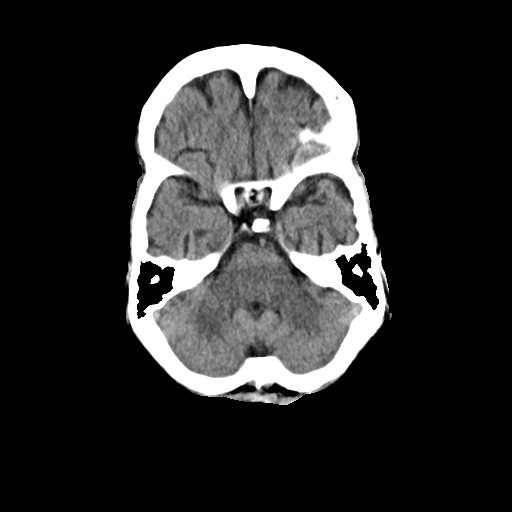
[im 9/36  brain]
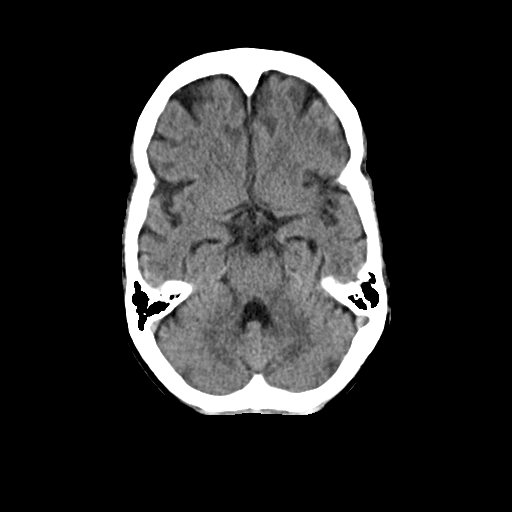
[im 10/36  brain]
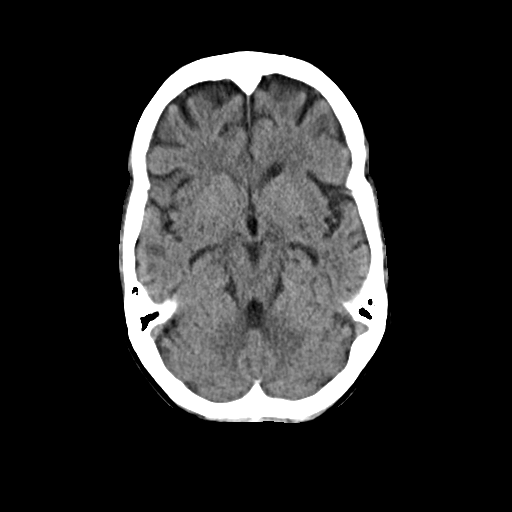
[im 10/36  bone]
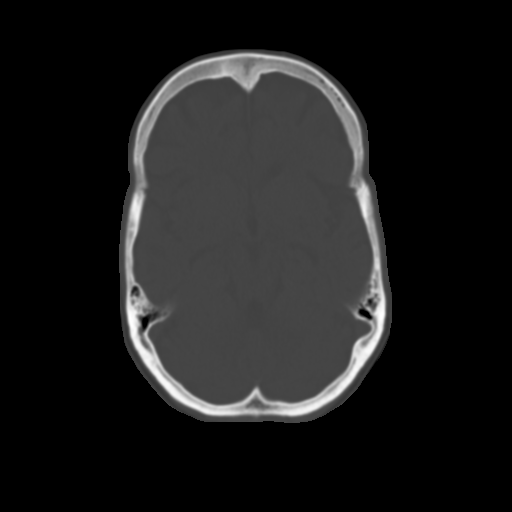
[im 13/36  brain]
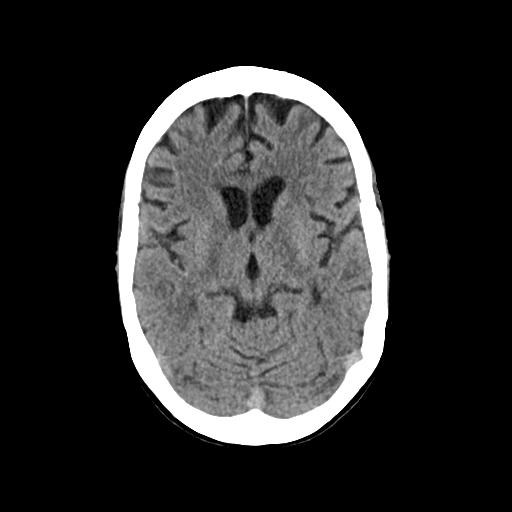
[im 15/36  brain]
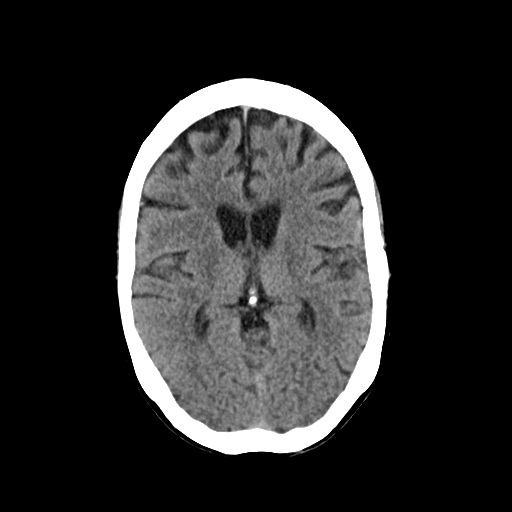
[im 17/36  brain]
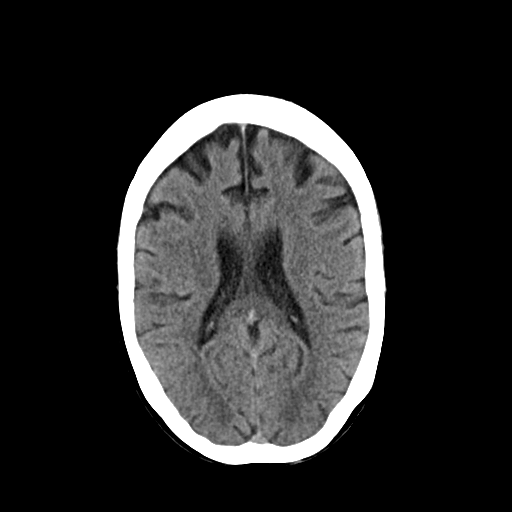
[im 19/36  brain]
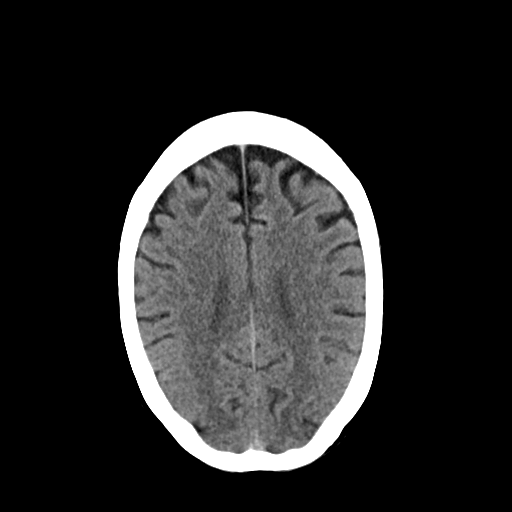
[im 19/36  bone]
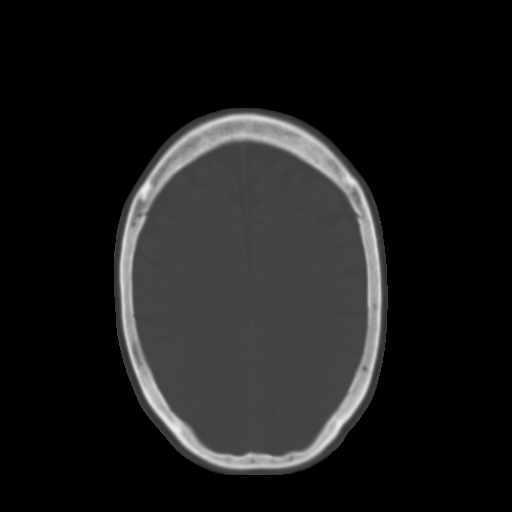
[im 21/36  brain]
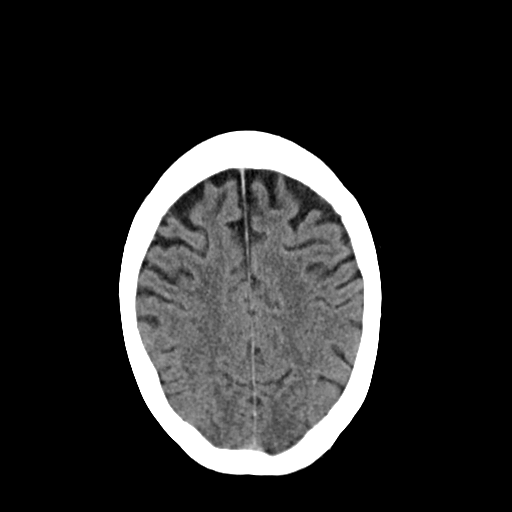
[im 23/36  brain]
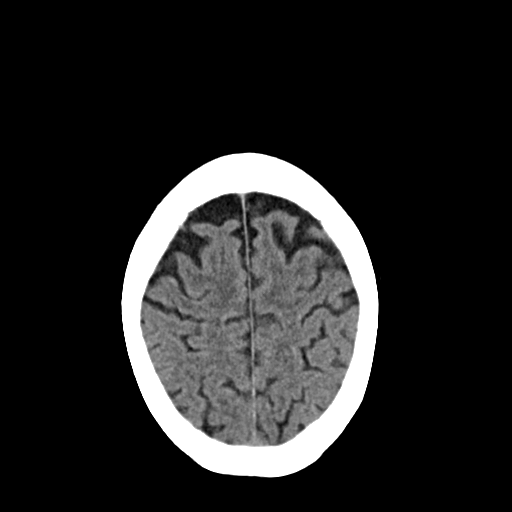
[im 26/36  brain]
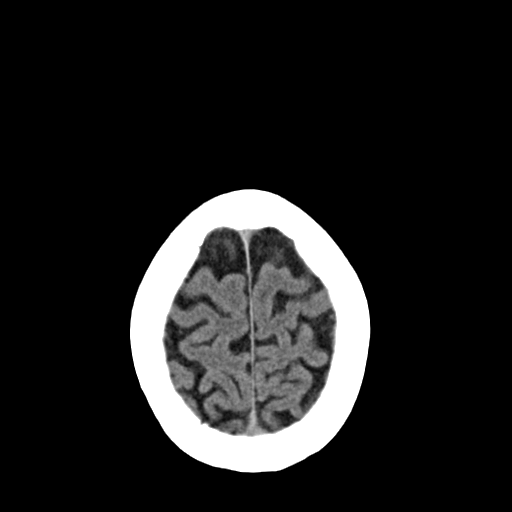
[im 27/36  brain]
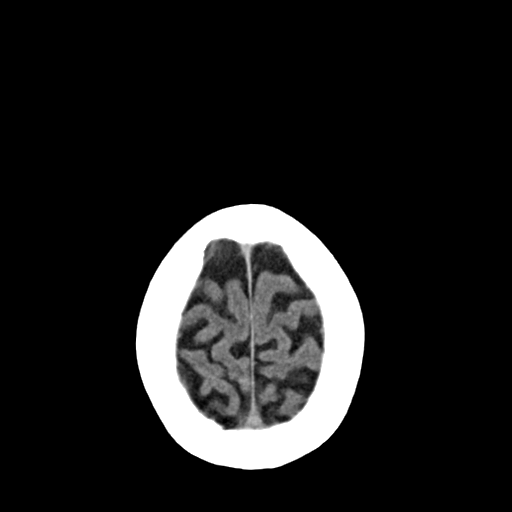
[im 27/36  bone]
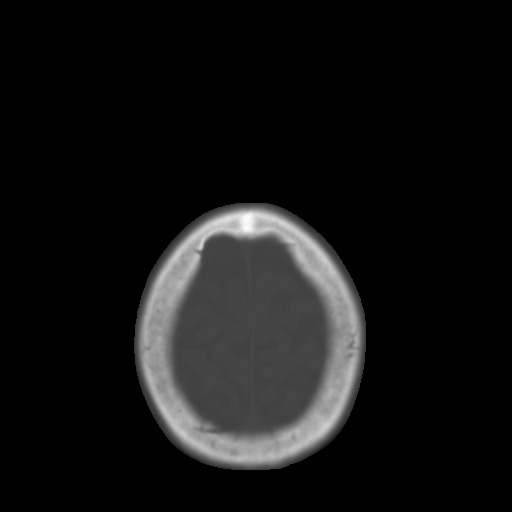
[im 29/36  brain]
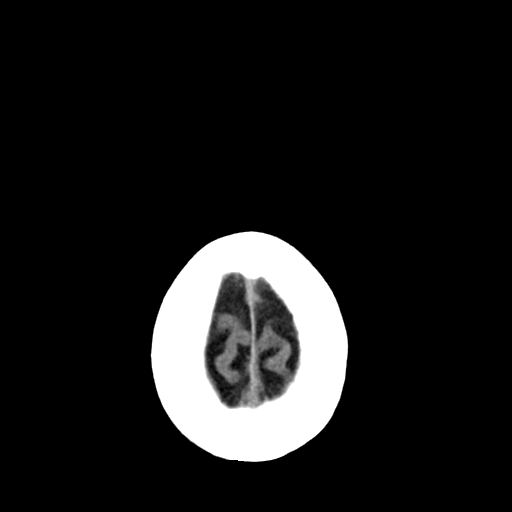
[im 32/36  brain]
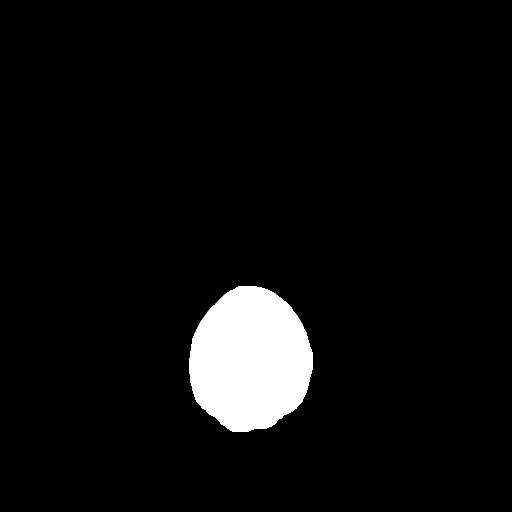
[im 34/36  brain]
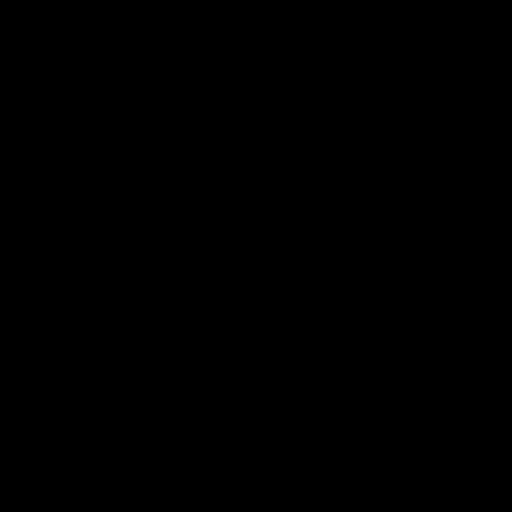

[16 of 30 positions shown; findings below may reference images not displayed]

FINDINGS: Brain: Generalized brain atrophy with relative frontal predominance.
No sign of old or acute focal infarction, mass lesion, hemorrhage,
hydrocephalus or extra-axial collection.

Vascular: No abnormal vascular finding.

Skull: Normal

Sinuses/Orbits: Clear/normal

Other: None
IMPRESSION: No acute or reversible finding. Generalized brain atrophy with
relative frontal lobe predominance.

## 2020-12-13 DIAGNOSIS — Z9181 History of falling: Secondary | ICD-10-CM | POA: Diagnosis not present

## 2020-12-13 DIAGNOSIS — R2681 Unsteadiness on feet: Secondary | ICD-10-CM | POA: Diagnosis not present

## 2020-12-13 DIAGNOSIS — R251 Tremor, unspecified: Secondary | ICD-10-CM | POA: Diagnosis not present

## 2020-12-13 DIAGNOSIS — R2689 Other abnormalities of gait and mobility: Secondary | ICD-10-CM | POA: Diagnosis not present

## 2020-12-13 DIAGNOSIS — F03911 Unspecified dementia, unspecified severity, with agitation: Secondary | ICD-10-CM | POA: Diagnosis not present

## 2020-12-14 DIAGNOSIS — G3109 Other frontotemporal dementia: Secondary | ICD-10-CM | POA: Diagnosis not present

## 2020-12-14 DIAGNOSIS — R21 Rash and other nonspecific skin eruption: Secondary | ICD-10-CM | POA: Diagnosis not present

## 2020-12-18 DIAGNOSIS — R5383 Other fatigue: Secondary | ICD-10-CM | POA: Diagnosis not present

## 2020-12-18 DIAGNOSIS — G3109 Other frontotemporal dementia: Secondary | ICD-10-CM | POA: Diagnosis not present

## 2020-12-19 DIAGNOSIS — E569 Vitamin deficiency, unspecified: Secondary | ICD-10-CM | POA: Diagnosis not present

## 2020-12-19 DIAGNOSIS — I10 Essential (primary) hypertension: Secondary | ICD-10-CM | POA: Diagnosis not present

## 2020-12-19 DIAGNOSIS — Z79899 Other long term (current) drug therapy: Secondary | ICD-10-CM | POA: Diagnosis not present

## 2020-12-20 DIAGNOSIS — N390 Urinary tract infection, site not specified: Secondary | ICD-10-CM | POA: Diagnosis not present

## 2020-12-21 DIAGNOSIS — N3000 Acute cystitis without hematuria: Secondary | ICD-10-CM | POA: Diagnosis not present

## 2020-12-21 DIAGNOSIS — R410 Disorientation, unspecified: Secondary | ICD-10-CM | POA: Diagnosis not present

## 2020-12-21 DIAGNOSIS — G3109 Other frontotemporal dementia: Secondary | ICD-10-CM | POA: Diagnosis not present

## 2020-12-21 DIAGNOSIS — R5383 Other fatigue: Secondary | ICD-10-CM | POA: Diagnosis not present

## 2020-12-24 DIAGNOSIS — N3000 Acute cystitis without hematuria: Secondary | ICD-10-CM | POA: Diagnosis not present

## 2020-12-24 DIAGNOSIS — F411 Generalized anxiety disorder: Secondary | ICD-10-CM | POA: Diagnosis not present

## 2020-12-24 DIAGNOSIS — R4182 Altered mental status, unspecified: Secondary | ICD-10-CM | POA: Diagnosis not present

## 2020-12-24 DIAGNOSIS — R5383 Other fatigue: Secondary | ICD-10-CM | POA: Diagnosis not present

## 2020-12-24 DIAGNOSIS — G3109 Other frontotemporal dementia: Secondary | ICD-10-CM | POA: Diagnosis not present

## 2020-12-24 DIAGNOSIS — R410 Disorientation, unspecified: Secondary | ICD-10-CM | POA: Diagnosis not present

## 2021-01-07 DIAGNOSIS — G3109 Other frontotemporal dementia: Secondary | ICD-10-CM | POA: Diagnosis not present

## 2021-01-07 DIAGNOSIS — F411 Generalized anxiety disorder: Secondary | ICD-10-CM | POA: Diagnosis not present

## 2021-01-07 DIAGNOSIS — F02C2 Dementia in other diseases classified elsewhere, severe, with psychotic disturbance: Secondary | ICD-10-CM | POA: Diagnosis not present

## 2021-01-08 DIAGNOSIS — R5383 Other fatigue: Secondary | ICD-10-CM | POA: Diagnosis not present

## 2021-01-08 DIAGNOSIS — F0390 Unspecified dementia without behavioral disturbance: Secondary | ICD-10-CM | POA: Diagnosis not present

## 2021-01-08 DIAGNOSIS — B379 Candidiasis, unspecified: Secondary | ICD-10-CM | POA: Diagnosis not present

## 2021-01-08 DIAGNOSIS — S70311A Abrasion, right thigh, initial encounter: Secondary | ICD-10-CM | POA: Diagnosis not present

## 2021-01-11 DIAGNOSIS — G3109 Other frontotemporal dementia: Secondary | ICD-10-CM | POA: Diagnosis not present

## 2021-01-11 DIAGNOSIS — F03911 Unspecified dementia, unspecified severity, with agitation: Secondary | ICD-10-CM | POA: Diagnosis not present

## 2021-01-11 DIAGNOSIS — Z87440 Personal history of urinary (tract) infections: Secondary | ICD-10-CM | POA: Diagnosis not present

## 2021-01-11 DIAGNOSIS — R21 Rash and other nonspecific skin eruption: Secondary | ICD-10-CM | POA: Diagnosis not present

## 2021-01-16 DIAGNOSIS — R7981 Abnormal blood-gas level: Secondary | ICD-10-CM | POA: Diagnosis not present

## 2021-01-16 DIAGNOSIS — R5383 Other fatigue: Secondary | ICD-10-CM | POA: Diagnosis not present

## 2021-01-16 DIAGNOSIS — Z87440 Personal history of urinary (tract) infections: Secondary | ICD-10-CM | POA: Diagnosis not present

## 2021-01-16 DIAGNOSIS — R569 Unspecified convulsions: Secondary | ICD-10-CM | POA: Diagnosis not present

## 2021-01-16 DIAGNOSIS — N390 Urinary tract infection, site not specified: Secondary | ICD-10-CM | POA: Diagnosis not present

## 2021-01-16 DIAGNOSIS — J9811 Atelectasis: Secondary | ICD-10-CM | POA: Diagnosis not present

## 2021-01-16 DIAGNOSIS — R918 Other nonspecific abnormal finding of lung field: Secondary | ICD-10-CM | POA: Diagnosis not present

## 2021-01-16 DIAGNOSIS — R251 Tremor, unspecified: Secondary | ICD-10-CM | POA: Diagnosis not present

## 2021-01-16 DIAGNOSIS — G3109 Other frontotemporal dementia: Secondary | ICD-10-CM | POA: Diagnosis not present

## 2021-01-16 DIAGNOSIS — R001 Bradycardia, unspecified: Secondary | ICD-10-CM | POA: Diagnosis not present

## 2021-01-17 DIAGNOSIS — R569 Unspecified convulsions: Secondary | ICD-10-CM | POA: Diagnosis not present

## 2021-01-17 DIAGNOSIS — R5383 Other fatigue: Secondary | ICD-10-CM | POA: Diagnosis not present

## 2021-01-17 DIAGNOSIS — R7981 Abnormal blood-gas level: Secondary | ICD-10-CM | POA: Diagnosis not present

## 2021-01-17 DIAGNOSIS — R251 Tremor, unspecified: Secondary | ICD-10-CM | POA: Diagnosis not present

## 2021-01-17 DIAGNOSIS — G3109 Other frontotemporal dementia: Secondary | ICD-10-CM | POA: Diagnosis not present

## 2021-01-17 DIAGNOSIS — J189 Pneumonia, unspecified organism: Secondary | ICD-10-CM | POA: Diagnosis not present

## 2021-01-18 DIAGNOSIS — G3109 Other frontotemporal dementia: Secondary | ICD-10-CM | POA: Diagnosis not present

## 2021-01-18 DIAGNOSIS — R5383 Other fatigue: Secondary | ICD-10-CM | POA: Diagnosis not present

## 2021-01-18 DIAGNOSIS — J189 Pneumonia, unspecified organism: Secondary | ICD-10-CM | POA: Diagnosis not present

## 2021-01-18 DIAGNOSIS — R569 Unspecified convulsions: Secondary | ICD-10-CM | POA: Diagnosis not present

## 2021-01-23 DIAGNOSIS — R4182 Altered mental status, unspecified: Secondary | ICD-10-CM | POA: Diagnosis not present

## 2021-01-23 DIAGNOSIS — Z79899 Other long term (current) drug therapy: Secondary | ICD-10-CM | POA: Diagnosis not present

## 2021-01-24 DIAGNOSIS — M4722 Other spondylosis with radiculopathy, cervical region: Secondary | ICD-10-CM | POA: Diagnosis not present

## 2021-01-30 DIAGNOSIS — R4182 Altered mental status, unspecified: Secondary | ICD-10-CM | POA: Diagnosis not present

## 2021-01-30 DIAGNOSIS — Z79899 Other long term (current) drug therapy: Secondary | ICD-10-CM | POA: Diagnosis not present

## 2021-02-04 DIAGNOSIS — F02C2 Dementia in other diseases classified elsewhere, severe, with psychotic disturbance: Secondary | ICD-10-CM | POA: Diagnosis not present

## 2021-02-04 DIAGNOSIS — G3109 Other frontotemporal dementia: Secondary | ICD-10-CM | POA: Diagnosis not present

## 2021-02-04 DIAGNOSIS — F411 Generalized anxiety disorder: Secondary | ICD-10-CM | POA: Diagnosis not present

## 2021-02-05 DIAGNOSIS — R051 Acute cough: Secondary | ICD-10-CM | POA: Diagnosis not present

## 2021-02-06 DIAGNOSIS — R4182 Altered mental status, unspecified: Secondary | ICD-10-CM | POA: Diagnosis not present

## 2021-02-06 DIAGNOSIS — R531 Weakness: Secondary | ICD-10-CM | POA: Diagnosis not present

## 2021-02-06 DIAGNOSIS — E7800 Pure hypercholesterolemia, unspecified: Secondary | ICD-10-CM | POA: Diagnosis not present

## 2021-03-04 DIAGNOSIS — F411 Generalized anxiety disorder: Secondary | ICD-10-CM | POA: Diagnosis not present

## 2021-03-04 DIAGNOSIS — G3109 Other frontotemporal dementia: Secondary | ICD-10-CM | POA: Diagnosis not present

## 2021-03-04 DIAGNOSIS — F02C2 Dementia in other diseases classified elsewhere, severe, with psychotic disturbance: Secondary | ICD-10-CM | POA: Diagnosis not present

## 2021-03-05 DIAGNOSIS — R4182 Altered mental status, unspecified: Secondary | ICD-10-CM | POA: Diagnosis not present

## 2021-03-05 DIAGNOSIS — Z79899 Other long term (current) drug therapy: Secondary | ICD-10-CM | POA: Diagnosis not present

## 2021-03-05 DIAGNOSIS — J9811 Atelectasis: Secondary | ICD-10-CM | POA: Diagnosis not present

## 2021-04-22 DIAGNOSIS — G3109 Other frontotemporal dementia: Secondary | ICD-10-CM | POA: Diagnosis not present

## 2021-04-22 DIAGNOSIS — R2689 Other abnormalities of gait and mobility: Secondary | ICD-10-CM | POA: Diagnosis not present

## 2021-04-24 DIAGNOSIS — R2689 Other abnormalities of gait and mobility: Secondary | ICD-10-CM | POA: Diagnosis not present

## 2021-04-24 DIAGNOSIS — G3109 Other frontotemporal dementia: Secondary | ICD-10-CM | POA: Diagnosis not present

## 2021-04-29 DIAGNOSIS — R2689 Other abnormalities of gait and mobility: Secondary | ICD-10-CM | POA: Diagnosis not present

## 2021-04-29 DIAGNOSIS — G3109 Other frontotemporal dementia: Secondary | ICD-10-CM | POA: Diagnosis not present

## 2021-05-01 DIAGNOSIS — R2689 Other abnormalities of gait and mobility: Secondary | ICD-10-CM | POA: Diagnosis not present

## 2021-05-01 DIAGNOSIS — G3109 Other frontotemporal dementia: Secondary | ICD-10-CM | POA: Diagnosis not present

## 2021-05-06 DIAGNOSIS — G3109 Other frontotemporal dementia: Secondary | ICD-10-CM | POA: Diagnosis not present

## 2021-05-06 DIAGNOSIS — R2689 Other abnormalities of gait and mobility: Secondary | ICD-10-CM | POA: Diagnosis not present

## 2021-05-08 DIAGNOSIS — R2689 Other abnormalities of gait and mobility: Secondary | ICD-10-CM | POA: Diagnosis not present

## 2021-05-08 DIAGNOSIS — G3109 Other frontotemporal dementia: Secondary | ICD-10-CM | POA: Diagnosis not present

## 2021-05-13 DIAGNOSIS — G3109 Other frontotemporal dementia: Secondary | ICD-10-CM | POA: Diagnosis not present

## 2021-05-13 DIAGNOSIS — R2689 Other abnormalities of gait and mobility: Secondary | ICD-10-CM | POA: Diagnosis not present

## 2021-05-16 DIAGNOSIS — R918 Other nonspecific abnormal finding of lung field: Secondary | ICD-10-CM | POA: Diagnosis not present

## 2021-05-16 DIAGNOSIS — G3109 Other frontotemporal dementia: Secondary | ICD-10-CM | POA: Diagnosis not present

## 2021-05-16 DIAGNOSIS — R2689 Other abnormalities of gait and mobility: Secondary | ICD-10-CM | POA: Diagnosis not present

## 2021-05-16 DIAGNOSIS — I517 Cardiomegaly: Secondary | ICD-10-CM | POA: Diagnosis not present

## 2021-05-20 DIAGNOSIS — R2689 Other abnormalities of gait and mobility: Secondary | ICD-10-CM | POA: Diagnosis not present

## 2021-05-20 DIAGNOSIS — G3109 Other frontotemporal dementia: Secondary | ICD-10-CM | POA: Diagnosis not present

## 2021-05-22 DIAGNOSIS — R2689 Other abnormalities of gait and mobility: Secondary | ICD-10-CM | POA: Diagnosis not present

## 2021-05-22 DIAGNOSIS — G3109 Other frontotemporal dementia: Secondary | ICD-10-CM | POA: Diagnosis not present

## 2021-05-23 ENCOUNTER — Ambulatory Visit: Payer: Medicare HMO | Admitting: Neurology

## 2021-05-24 DIAGNOSIS — E569 Vitamin deficiency, unspecified: Secondary | ICD-10-CM | POA: Diagnosis not present

## 2021-05-24 DIAGNOSIS — I10 Essential (primary) hypertension: Secondary | ICD-10-CM | POA: Diagnosis not present

## 2021-05-27 DIAGNOSIS — G3109 Other frontotemporal dementia: Secondary | ICD-10-CM | POA: Diagnosis not present

## 2021-05-27 DIAGNOSIS — R2689 Other abnormalities of gait and mobility: Secondary | ICD-10-CM | POA: Diagnosis not present

## 2021-05-28 DIAGNOSIS — R4182 Altered mental status, unspecified: Secondary | ICD-10-CM | POA: Diagnosis not present

## 2021-05-30 DIAGNOSIS — R2689 Other abnormalities of gait and mobility: Secondary | ICD-10-CM | POA: Diagnosis not present

## 2021-05-30 DIAGNOSIS — G3109 Other frontotemporal dementia: Secondary | ICD-10-CM | POA: Diagnosis not present

## 2021-06-04 DIAGNOSIS — R2689 Other abnormalities of gait and mobility: Secondary | ICD-10-CM | POA: Diagnosis not present

## 2021-06-04 DIAGNOSIS — F0150 Vascular dementia without behavioral disturbance: Secondary | ICD-10-CM | POA: Diagnosis not present

## 2021-06-04 DIAGNOSIS — R1311 Dysphagia, oral phase: Secondary | ICD-10-CM | POA: Diagnosis not present

## 2021-06-04 DIAGNOSIS — G3109 Other frontotemporal dementia: Secondary | ICD-10-CM | POA: Diagnosis not present

## 2021-06-05 DIAGNOSIS — G3109 Other frontotemporal dementia: Secondary | ICD-10-CM | POA: Diagnosis not present

## 2021-06-05 DIAGNOSIS — F0150 Vascular dementia without behavioral disturbance: Secondary | ICD-10-CM | POA: Diagnosis not present

## 2021-06-05 DIAGNOSIS — R1311 Dysphagia, oral phase: Secondary | ICD-10-CM | POA: Diagnosis not present

## 2021-06-05 DIAGNOSIS — R2689 Other abnormalities of gait and mobility: Secondary | ICD-10-CM | POA: Diagnosis not present

## 2021-06-07 DIAGNOSIS — R2689 Other abnormalities of gait and mobility: Secondary | ICD-10-CM | POA: Diagnosis not present

## 2021-06-07 DIAGNOSIS — F0150 Vascular dementia without behavioral disturbance: Secondary | ICD-10-CM | POA: Diagnosis not present

## 2021-06-07 DIAGNOSIS — R1311 Dysphagia, oral phase: Secondary | ICD-10-CM | POA: Diagnosis not present

## 2021-06-07 DIAGNOSIS — G3109 Other frontotemporal dementia: Secondary | ICD-10-CM | POA: Diagnosis not present

## 2021-06-10 DIAGNOSIS — F331 Major depressive disorder, recurrent, moderate: Secondary | ICD-10-CM | POA: Diagnosis not present

## 2021-06-10 DIAGNOSIS — R1311 Dysphagia, oral phase: Secondary | ICD-10-CM | POA: Diagnosis not present

## 2021-06-10 DIAGNOSIS — R2689 Other abnormalities of gait and mobility: Secondary | ICD-10-CM | POA: Diagnosis not present

## 2021-06-10 DIAGNOSIS — F02C2 Dementia in other diseases classified elsewhere, severe, with psychotic disturbance: Secondary | ICD-10-CM | POA: Diagnosis not present

## 2021-06-10 DIAGNOSIS — G3109 Other frontotemporal dementia: Secondary | ICD-10-CM | POA: Diagnosis not present

## 2021-06-10 DIAGNOSIS — F0150 Vascular dementia without behavioral disturbance: Secondary | ICD-10-CM | POA: Diagnosis not present

## 2021-06-12 DIAGNOSIS — R2689 Other abnormalities of gait and mobility: Secondary | ICD-10-CM | POA: Diagnosis not present

## 2021-06-12 DIAGNOSIS — G3109 Other frontotemporal dementia: Secondary | ICD-10-CM | POA: Diagnosis not present

## 2021-06-12 DIAGNOSIS — F0150 Vascular dementia without behavioral disturbance: Secondary | ICD-10-CM | POA: Diagnosis not present

## 2021-06-12 DIAGNOSIS — R1311 Dysphagia, oral phase: Secondary | ICD-10-CM | POA: Diagnosis not present

## 2021-06-14 DIAGNOSIS — R1311 Dysphagia, oral phase: Secondary | ICD-10-CM | POA: Diagnosis not present

## 2021-06-14 DIAGNOSIS — F0150 Vascular dementia without behavioral disturbance: Secondary | ICD-10-CM | POA: Diagnosis not present

## 2021-06-14 DIAGNOSIS — G3109 Other frontotemporal dementia: Secondary | ICD-10-CM | POA: Diagnosis not present

## 2021-06-14 DIAGNOSIS — R2689 Other abnormalities of gait and mobility: Secondary | ICD-10-CM | POA: Diagnosis not present

## 2021-06-17 DIAGNOSIS — G3109 Other frontotemporal dementia: Secondary | ICD-10-CM | POA: Diagnosis not present

## 2021-06-20 DIAGNOSIS — G3109 Other frontotemporal dementia: Secondary | ICD-10-CM | POA: Diagnosis not present

## 2021-06-21 DIAGNOSIS — G3109 Other frontotemporal dementia: Secondary | ICD-10-CM | POA: Diagnosis not present

## 2021-06-24 DIAGNOSIS — G3109 Other frontotemporal dementia: Secondary | ICD-10-CM | POA: Diagnosis not present

## 2021-06-26 DIAGNOSIS — F331 Major depressive disorder, recurrent, moderate: Secondary | ICD-10-CM | POA: Diagnosis not present

## 2021-06-26 DIAGNOSIS — F0394 Unspecified dementia, unspecified severity, with anxiety: Secondary | ICD-10-CM | POA: Diagnosis not present

## 2021-06-26 DIAGNOSIS — G3109 Other frontotemporal dementia: Secondary | ICD-10-CM | POA: Diagnosis not present

## 2021-06-26 DIAGNOSIS — E039 Hypothyroidism, unspecified: Secondary | ICD-10-CM | POA: Diagnosis not present

## 2021-06-26 DIAGNOSIS — R251 Tremor, unspecified: Secondary | ICD-10-CM | POA: Diagnosis not present

## 2021-06-26 DIAGNOSIS — Z7689 Persons encountering health services in other specified circumstances: Secondary | ICD-10-CM | POA: Diagnosis not present

## 2021-07-01 DIAGNOSIS — G3109 Other frontotemporal dementia: Secondary | ICD-10-CM | POA: Diagnosis not present

## 2021-07-02 DIAGNOSIS — G3109 Other frontotemporal dementia: Secondary | ICD-10-CM | POA: Diagnosis not present

## 2021-07-03 DIAGNOSIS — G3109 Other frontotemporal dementia: Secondary | ICD-10-CM | POA: Diagnosis not present

## 2021-07-04 DIAGNOSIS — G3109 Other frontotemporal dementia: Secondary | ICD-10-CM | POA: Diagnosis not present

## 2021-07-04 DIAGNOSIS — F0150 Vascular dementia without behavioral disturbance: Secondary | ICD-10-CM | POA: Diagnosis not present

## 2021-07-04 DIAGNOSIS — R1311 Dysphagia, oral phase: Secondary | ICD-10-CM | POA: Diagnosis not present

## 2021-07-05 DIAGNOSIS — F0150 Vascular dementia without behavioral disturbance: Secondary | ICD-10-CM | POA: Diagnosis not present

## 2021-07-05 DIAGNOSIS — R1311 Dysphagia, oral phase: Secondary | ICD-10-CM | POA: Diagnosis not present

## 2021-07-05 DIAGNOSIS — G3109 Other frontotemporal dementia: Secondary | ICD-10-CM | POA: Diagnosis not present

## 2021-07-10 DIAGNOSIS — R051 Acute cough: Secondary | ICD-10-CM | POA: Diagnosis not present

## 2021-07-10 DIAGNOSIS — I517 Cardiomegaly: Secondary | ICD-10-CM | POA: Diagnosis not present

## 2021-07-12 DIAGNOSIS — G4700 Insomnia, unspecified: Secondary | ICD-10-CM | POA: Diagnosis not present

## 2021-07-12 DIAGNOSIS — K5900 Constipation, unspecified: Secondary | ICD-10-CM | POA: Diagnosis not present

## 2021-07-12 DIAGNOSIS — F0394 Unspecified dementia, unspecified severity, with anxiety: Secondary | ICD-10-CM | POA: Diagnosis not present

## 2021-07-12 DIAGNOSIS — F331 Major depressive disorder, recurrent, moderate: Secondary | ICD-10-CM | POA: Diagnosis not present

## 2021-07-12 DIAGNOSIS — E559 Vitamin D deficiency, unspecified: Secondary | ICD-10-CM | POA: Diagnosis not present

## 2021-07-12 DIAGNOSIS — E039 Hypothyroidism, unspecified: Secondary | ICD-10-CM | POA: Diagnosis not present

## 2021-07-19 DIAGNOSIS — F0394 Unspecified dementia, unspecified severity, with anxiety: Secondary | ICD-10-CM | POA: Diagnosis not present

## 2021-07-19 DIAGNOSIS — R2243 Localized swelling, mass and lump, lower limb, bilateral: Secondary | ICD-10-CM | POA: Diagnosis not present

## 2021-07-22 DIAGNOSIS — F0394 Unspecified dementia, unspecified severity, with anxiety: Secondary | ICD-10-CM | POA: Diagnosis not present

## 2021-07-22 DIAGNOSIS — R2243 Localized swelling, mass and lump, lower limb, bilateral: Secondary | ICD-10-CM | POA: Diagnosis not present

## 2021-07-25 DIAGNOSIS — R2243 Localized swelling, mass and lump, lower limb, bilateral: Secondary | ICD-10-CM | POA: Diagnosis not present

## 2021-07-25 DIAGNOSIS — F0394 Unspecified dementia, unspecified severity, with anxiety: Secondary | ICD-10-CM | POA: Diagnosis not present

## 2021-07-25 DIAGNOSIS — E039 Hypothyroidism, unspecified: Secondary | ICD-10-CM | POA: Diagnosis not present

## 2021-07-26 DIAGNOSIS — E039 Hypothyroidism, unspecified: Secondary | ICD-10-CM | POA: Diagnosis not present

## 2021-08-20 DIAGNOSIS — L853 Xerosis cutis: Secondary | ICD-10-CM | POA: Diagnosis not present

## 2021-08-20 DIAGNOSIS — F0394 Unspecified dementia, unspecified severity, with anxiety: Secondary | ICD-10-CM | POA: Diagnosis not present

## 2021-08-20 DIAGNOSIS — H1033 Unspecified acute conjunctivitis, bilateral: Secondary | ICD-10-CM | POA: Diagnosis not present

## 2021-08-20 DIAGNOSIS — R2243 Localized swelling, mass and lump, lower limb, bilateral: Secondary | ICD-10-CM | POA: Diagnosis not present

## 2021-08-26 DIAGNOSIS — F02C2 Dementia in other diseases classified elsewhere, severe, with psychotic disturbance: Secondary | ICD-10-CM | POA: Diagnosis not present

## 2021-08-26 DIAGNOSIS — F331 Major depressive disorder, recurrent, moderate: Secondary | ICD-10-CM | POA: Diagnosis not present

## 2021-08-26 DIAGNOSIS — G3109 Other frontotemporal dementia: Secondary | ICD-10-CM | POA: Diagnosis not present

## 2021-08-26 DIAGNOSIS — F411 Generalized anxiety disorder: Secondary | ICD-10-CM | POA: Diagnosis not present

## 2021-09-09 DIAGNOSIS — G3109 Other frontotemporal dementia: Secondary | ICD-10-CM | POA: Diagnosis not present

## 2021-09-09 DIAGNOSIS — F02C2 Dementia in other diseases classified elsewhere, severe, with psychotic disturbance: Secondary | ICD-10-CM | POA: Diagnosis not present

## 2021-09-11 DIAGNOSIS — H1033 Unspecified acute conjunctivitis, bilateral: Secondary | ICD-10-CM | POA: Diagnosis not present

## 2021-09-11 DIAGNOSIS — R2243 Localized swelling, mass and lump, lower limb, bilateral: Secondary | ICD-10-CM | POA: Diagnosis not present

## 2021-09-11 DIAGNOSIS — F0394 Unspecified dementia, unspecified severity, with anxiety: Secondary | ICD-10-CM | POA: Diagnosis not present

## 2021-09-11 DIAGNOSIS — R627 Adult failure to thrive: Secondary | ICD-10-CM | POA: Diagnosis not present

## 2021-09-12 ENCOUNTER — Inpatient Hospital Stay (HOSPITAL_COMMUNITY)
Admission: EM | Admit: 2021-09-12 | Discharge: 2021-09-14 | DRG: 193 | Disposition: A | Discharge status: Skilled Nursing Facility | Payer: Medicare HMO | Source: Skilled Nursing Facility | Attending: Internal Medicine | Admitting: Internal Medicine

## 2021-09-12 ENCOUNTER — Encounter (HOSPITAL_COMMUNITY): Payer: Self-pay | Admitting: Internal Medicine

## 2021-09-12 ENCOUNTER — Emergency Department (HOSPITAL_COMMUNITY): Payer: Medicare HMO

## 2021-09-12 ENCOUNTER — Other Ambulatory Visit: Payer: Self-pay

## 2021-09-12 DIAGNOSIS — G3109 Other frontotemporal dementia: Secondary | ICD-10-CM | POA: Diagnosis present

## 2021-09-12 DIAGNOSIS — J9621 Acute and chronic respiratory failure with hypoxia: Secondary | ICD-10-CM | POA: Diagnosis not present

## 2021-09-12 DIAGNOSIS — R7301 Impaired fasting glucose: Secondary | ICD-10-CM | POA: Diagnosis present

## 2021-09-12 DIAGNOSIS — Z881 Allergy status to other antibiotic agents status: Secondary | ICD-10-CM

## 2021-09-12 DIAGNOSIS — K219 Gastro-esophageal reflux disease without esophagitis: Secondary | ICD-10-CM | POA: Diagnosis not present

## 2021-09-12 DIAGNOSIS — Z9981 Dependence on supplemental oxygen: Secondary | ICD-10-CM

## 2021-09-12 DIAGNOSIS — F02818 Dementia in other diseases classified elsewhere, unspecified severity, with other behavioral disturbance: Secondary | ICD-10-CM | POA: Diagnosis present

## 2021-09-12 DIAGNOSIS — Z888 Allergy status to other drugs, medicaments and biological substances status: Secondary | ICD-10-CM

## 2021-09-12 DIAGNOSIS — E782 Mixed hyperlipidemia: Secondary | ICD-10-CM | POA: Diagnosis present

## 2021-09-12 DIAGNOSIS — E8809 Other disorders of plasma-protein metabolism, not elsewhere classified: Secondary | ICD-10-CM | POA: Diagnosis present

## 2021-09-12 DIAGNOSIS — Z79890 Hormone replacement therapy (postmenopausal): Secondary | ICD-10-CM

## 2021-09-12 DIAGNOSIS — R404 Transient alteration of awareness: Secondary | ICD-10-CM | POA: Diagnosis not present

## 2021-09-12 DIAGNOSIS — J189 Pneumonia, unspecified organism: Secondary | ICD-10-CM | POA: Diagnosis not present

## 2021-09-12 DIAGNOSIS — R0902 Hypoxemia: Secondary | ICD-10-CM | POA: Diagnosis not present

## 2021-09-12 DIAGNOSIS — R0689 Other abnormalities of breathing: Secondary | ICD-10-CM | POA: Diagnosis not present

## 2021-09-12 DIAGNOSIS — R824 Acetonuria: Secondary | ICD-10-CM | POA: Diagnosis present

## 2021-09-12 DIAGNOSIS — R0602 Shortness of breath: Secondary | ICD-10-CM | POA: Diagnosis not present

## 2021-09-12 DIAGNOSIS — J9622 Acute and chronic respiratory failure with hypercapnia: Secondary | ICD-10-CM | POA: Diagnosis present

## 2021-09-12 DIAGNOSIS — J9811 Atelectasis: Secondary | ICD-10-CM | POA: Diagnosis not present

## 2021-09-12 DIAGNOSIS — Z91013 Allergy to seafood: Secondary | ICD-10-CM | POA: Diagnosis not present

## 2021-09-12 DIAGNOSIS — Z993 Dependence on wheelchair: Secondary | ICD-10-CM

## 2021-09-12 DIAGNOSIS — Z20822 Contact with and (suspected) exposure to covid-19: Secondary | ICD-10-CM | POA: Diagnosis not present

## 2021-09-12 DIAGNOSIS — H1031 Unspecified acute conjunctivitis, right eye: Secondary | ICD-10-CM | POA: Diagnosis not present

## 2021-09-12 DIAGNOSIS — Z807 Family history of other malignant neoplasms of lymphoid, hematopoietic and related tissues: Secondary | ICD-10-CM | POA: Diagnosis not present

## 2021-09-12 DIAGNOSIS — Z79899 Other long term (current) drug therapy: Secondary | ICD-10-CM | POA: Diagnosis not present

## 2021-09-12 DIAGNOSIS — E039 Hypothyroidism, unspecified: Secondary | ICD-10-CM | POA: Diagnosis present

## 2021-09-12 DIAGNOSIS — Z66 Do not resuscitate: Secondary | ICD-10-CM | POA: Diagnosis not present

## 2021-09-12 DIAGNOSIS — Z7401 Bed confinement status: Secondary | ICD-10-CM | POA: Diagnosis not present

## 2021-09-12 DIAGNOSIS — Z880 Allergy status to penicillin: Secondary | ICD-10-CM

## 2021-09-12 DIAGNOSIS — F02C0 Dementia in other diseases classified elsewhere, severe, without behavioral disturbance, psychotic disturbance, mood disturbance, and anxiety: Secondary | ICD-10-CM

## 2021-09-12 DIAGNOSIS — Z8701 Personal history of pneumonia (recurrent): Secondary | ICD-10-CM

## 2021-09-12 DIAGNOSIS — R4020 Unspecified coma: Secondary | ICD-10-CM | POA: Diagnosis not present

## 2021-09-12 DIAGNOSIS — Z800 Family history of malignant neoplasm of digestive organs: Secondary | ICD-10-CM | POA: Diagnosis not present

## 2021-09-12 DIAGNOSIS — Y95 Nosocomial condition: Secondary | ICD-10-CM | POA: Diagnosis present

## 2021-09-12 DIAGNOSIS — R4182 Altered mental status, unspecified: Secondary | ICD-10-CM | POA: Diagnosis not present

## 2021-09-12 LAB — CBC WITH DIFFERENTIAL/PLATELET
Abs Immature Granulocytes: 0.01 10*3/uL (ref 0.00–0.07)
Basophils Absolute: 0 10*3/uL (ref 0.0–0.1)
Basophils Relative: 0 %
Eosinophils Absolute: 0 10*3/uL (ref 0.0–0.5)
Eosinophils Relative: 1 %
HCT: 42.9 % (ref 36.0–46.0)
Hemoglobin: 13 g/dL (ref 12.0–15.0)
Immature Granulocytes: 0 %
Lymphocytes Relative: 16 %
Lymphs Abs: 1.3 10*3/uL (ref 0.7–4.0)
MCH: 29.8 pg (ref 26.0–34.0)
MCHC: 30.3 g/dL (ref 30.0–36.0)
MCV: 98.4 fL (ref 80.0–100.0)
Monocytes Absolute: 0.9 10*3/uL (ref 0.1–1.0)
Monocytes Relative: 11 %
Neutro Abs: 6 10*3/uL (ref 1.7–7.7)
Neutrophils Relative %: 72 %
Platelets: 184 10*3/uL (ref 150–400)
RBC: 4.36 MIL/uL (ref 3.87–5.11)
RDW: 14.2 % (ref 11.5–15.5)
WBC: 8.2 10*3/uL (ref 4.0–10.5)
nRBC: 0 % (ref 0.0–0.2)

## 2021-09-12 LAB — URINALYSIS, ROUTINE W REFLEX MICROSCOPIC
Bilirubin Urine: NEGATIVE
Glucose, UA: NEGATIVE mg/dL
Hgb urine dipstick: NEGATIVE
Ketones, ur: 5 mg/dL — AB
Nitrite: NEGATIVE
Protein, ur: NEGATIVE mg/dL
Specific Gravity, Urine: 1.02 (ref 1.005–1.030)
pH: 5 (ref 5.0–8.0)

## 2021-09-12 LAB — BLOOD GAS, VENOUS
Acid-Base Excess: 14.7 mmol/L — ABNORMAL HIGH (ref 0.0–2.0)
Acid-Base Excess: 16.1 mmol/L — ABNORMAL HIGH (ref 0.0–2.0)
Bicarbonate: 44.8 mmol/L — ABNORMAL HIGH (ref 20.0–28.0)
Bicarbonate: 45.8 mmol/L — ABNORMAL HIGH (ref 20.0–28.0)
Drawn by: 63002
Drawn by: 6807
O2 Saturation: 61.2 %
O2 Saturation: 34.8 %
Patient temperature: 37.4
Patient temperature: 37.3
pCO2, Ven: 86 mmHg (ref 44–60)
pCO2, Ven: 82 mmHg (ref 44–60)
pH, Ven: 7.32 (ref 7.25–7.43)
pH, Ven: 7.36 (ref 7.25–7.43)
pO2, Ven: 39 mmHg (ref 32–45)
pO2, Ven: <31 mmHg — CL (ref 32–45)

## 2021-09-12 LAB — BASIC METABOLIC PANEL
Anion gap: 6 (ref 5–15)
BUN: 21 mg/dL (ref 8–23)
CO2: 39 mmol/L — ABNORMAL HIGH (ref 22–32)
Calcium: 9.7 mg/dL (ref 8.9–10.3)
Chloride: 99 mmol/L (ref 98–111)
Creatinine, Ser: 0.89 mg/dL (ref 0.44–1.00)
GFR, Estimated: >60 mL/min (ref >60)
Glucose, Bld: 78 mg/dL (ref 70–99)
Potassium: 4.4 mmol/L (ref 3.5–5.1)
Sodium: 144 mmol/L (ref 135–145)

## 2021-09-12 LAB — MRSA NEXT GEN BY PCR, NASAL: MRSA by PCR Next Gen: NOT DETECTED

## 2021-09-12 LAB — SARS CORONAVIRUS 2 BY RT PCR: SARS Coronavirus 2 by RT PCR: NEGATIVE

## 2021-09-12 MED ORDER — FAMOTIDINE 20 MG PO TABS
20.0000 mg | ORAL_TABLET | Freq: Two times a day (BID) | ORAL | Status: DC
  Administered 2021-09-12 – 2021-09-14 (×4): 20 mg via ORAL
  Filled 2021-09-12 (×4): qty 1

## 2021-09-12 MED ORDER — CEFEPIME HCL 2 G IV SOLR
2.0000 g | Freq: Once | INTRAVENOUS | Status: AC
  Administered 2021-09-12: 2 g via INTRAVENOUS
  Filled 2021-09-12: qty 12.5

## 2021-09-12 MED ORDER — BENZTROPINE MESYLATE 0.5 MG PO TABS
1.0000 mg | ORAL_TABLET | Freq: Every day | ORAL | Status: DC
  Administered 2021-09-12 – 2021-09-13 (×2): 1 mg via ORAL
  Filled 2021-09-12 (×2): qty 2

## 2021-09-12 MED ORDER — VANCOMYCIN HCL IN DEXTROSE 1-5 GM/200ML-% IV SOLN
1000.0000 mg | Freq: Once | INTRAVENOUS | Status: AC
  Administered 2021-09-12: 1000 mg via INTRAVENOUS
  Filled 2021-09-12: qty 200

## 2021-09-12 MED ORDER — IPRATROPIUM-ALBUTEROL 0.5-2.5 (3) MG/3ML IN SOLN: 3.0000 mL | RESPIRATORY_TRACT | Status: DC | PRN

## 2021-09-12 MED ORDER — SERTRALINE HCL 50 MG PO TABS
50.0000 mg | ORAL_TABLET | Freq: Every day | ORAL | Status: DC
  Administered 2021-09-13 – 2021-09-14 (×2): 50 mg via ORAL
  Filled 2021-09-12 (×2): qty 1

## 2021-09-12 MED ORDER — ONDANSETRON HCL 4 MG/2ML IJ SOLN: 4.0000 mg | Freq: Four times a day (QID) | INTRAMUSCULAR | Status: DC | PRN

## 2021-09-12 MED ORDER — LORAZEPAM 0.5 MG PO TABS: 0.5000 mg | ORAL_TABLET | Freq: Four times a day (QID) | ORAL | Status: DC | PRN

## 2021-09-12 MED ORDER — DIVALPROEX SODIUM ER 250 MG PO TB24
250.0000 mg | ORAL_TABLET | Freq: Two times a day (BID) | ORAL | Status: DC
  Administered 2021-09-12 – 2021-09-14 (×4): 250 mg via ORAL
  Filled 2021-09-12 (×5): qty 1

## 2021-09-12 MED ORDER — ONDANSETRON HCL 4 MG PO TABS: 4.0000 mg | ORAL_TABLET | Freq: Four times a day (QID) | ORAL | Status: DC | PRN

## 2021-09-12 MED ORDER — SODIUM CHLORIDE 0.9 % IV SOLN
1.0000 g | INTRAVENOUS | Status: DC
  Administered 2021-09-12 – 2021-09-13 (×2): 1 g via INTRAVENOUS
  Filled 2021-09-12 (×3): qty 10

## 2021-09-12 MED ORDER — CIPROFLOXACIN HCL 0.3 % OP SOLN
2.0000 [drp] | OPHTHALMIC | Status: DC
  Administered 2021-09-12 – 2021-09-14 (×10): 2 [drp] via OPHTHALMIC
  Filled 2021-09-12: qty 2.5

## 2021-09-12 MED ORDER — ENOXAPARIN SODIUM 40 MG/0.4ML IJ SOSY
40.0000 mg | PREFILLED_SYRINGE | INTRAMUSCULAR | Status: DC
  Administered 2021-09-12 – 2021-09-13 (×2): 40 mg via SUBCUTANEOUS
  Filled 2021-09-12 (×2): qty 0.4

## 2021-09-12 MED ORDER — SODIUM CHLORIDE 0.9 % IV SOLN
500.0000 mg | INTRAVENOUS | Status: DC
  Administered 2021-09-12 – 2021-09-13 (×2): 500 mg via INTRAVENOUS
  Filled 2021-09-12 (×2): qty 5

## 2021-09-12 MED ORDER — ACETAMINOPHEN 650 MG RE SUPP: 650.0000 mg | Freq: Four times a day (QID) | RECTAL | Status: DC | PRN

## 2021-09-12 MED ORDER — QUETIAPINE 12.5 MG HALF TABLET
25.0000 mg | ORAL_TABLET | Freq: Every day | ORAL | Status: DC
  Administered 2021-09-12 – 2021-09-13 (×2): 25 mg via ORAL
  Filled 2021-09-12 (×3): qty 2

## 2021-09-12 MED ORDER — LEVOTHYROXINE SODIUM 75 MCG PO TABS
75.0000 ug | ORAL_TABLET | Freq: Every day | ORAL | Status: DC
  Administered 2021-09-13 – 2021-09-14 (×2): 75 ug via ORAL
  Filled 2021-09-12 (×2): qty 1

## 2021-09-12 MED ORDER — CIPROFLOXACIN HCL 0.3 % OP SOLN
2.0000 [drp] | OPHTHALMIC | Status: DC
Start: 2021-09-12 — End: 2021-09-12

## 2021-09-12 MED ORDER — ACETAMINOPHEN 325 MG PO TABS
650.0000 mg | ORAL_TABLET | Freq: Four times a day (QID) | ORAL | Status: DC | PRN
  Administered 2021-09-13 (×2): 650 mg via ORAL
  Filled 2021-09-12 (×2): qty 2

## 2021-09-12 NOTE — TOC Initial Note (Signed)
Transition of Care [facility]) - Initial/Assessment Note    Patient Details  Name: Kaitlyn Bryant MRN: [id number] Date of Birth: [birth year]  Transition of [facility] CM/SW Contact:    Lanier Clam, RN Phone Number: 09/12/2021, 4:48 PM  Clinical Narrative: confirmed from [facility] LTC rep [place].spoke to spouse agree to return back.Facility did receive referral for Palliative care but not initiated yet.                 Expected Discharge Plan: Skilled Nursing Facility Barriers to Discharge: Continued Medical Work up   Patient Goals and CMS Choice Patient states their goals for this hospitalization and ongoing recovery are::  (Rehab) CMS Medicare.gov Compare Post Acute Care list provided to:: Patient Represenative (must comment) Choice offered to / list presented to : Spouse  Expected Discharge Plan and Services Expected Discharge Plan: Skilled Nursing Facility   Discharge Planning Services: CM Consult Post Acute Care Choice: Nursing Home Living arrangements for the past 2 months:  (LTC)                                      Prior Living Arrangements/Services Living arrangements for the past 2 months:  (LTC)   Patient language and need for interpreter reviewed:: Yes Do you feel safe going back to the place where you live?: Yes      Need for Family Participation in Patient Care: Yes (Comment) Care giver support system in place?: Yes (comment)   Criminal Activity/Legal Involvement Pertinent to Current Situation/Hospitalization: No - Comment as needed  Activities of Daily Living Home Assistive Devices/Equipment: Other (Comment) (pt unable to verbalize) ADL Screening (condition at time of admission) Patient's cognitive ability adequate to safely complete daily activities?: Yes Does the patient have difficulty concentrating, remembering, or making decisions?: Yes Patient able to express need for assistance with ADLs?: No Does the patient have difficulty dressing or  bathing?: Yes Independently performs ADLs?: No Communication: Needs assistance Is this a change from baseline?: Pre-admission baseline Dressing (OT): Needs assistance Is this a change from baseline?: Pre-admission baseline Grooming: Needs assistance Is this a change from baseline?: Pre-admission baseline Feeding: Needs assistance Is this a change from baseline?: Pre-admission baseline Bathing: Needs assistance Is this a change from baseline?: Pre-admission baseline Toileting: Needs assistance Is this a change from baseline?: Pre-admission baseline In/Out Bed: Needs assistance Is this a change from baseline?: Pre-admission baseline Walks in Home: Needs assistance Is this a change from baseline?: Pre-admission baseline Does the patient have difficulty walking or climbing stairs?: Yes Weakness of Legs: Both Weakness of Arms/Hands: Both  Permission Sought/Granted Permission sought to share information with : Case Manager Permission granted to share information with : Yes, Verbal Permission Granted  Share Information with NAME:  (Case Manager)           Emotional Assessment Appearance:: Appears stated age Attitude/Demeanor/Rapport: Gracious Affect (typically observed): Accepting Orientation: : Oriented to Self, Oriented to Place, Oriented to  Time, Oriented to Situation Alcohol / Substance Use: Not Applicable Psych Involvement: No (comment)  Admission diagnosis:  Healthcare-associated pneumonia [J18.9] Acute on chronic respiratory failure with hypoxia (HCC) [J96.21] Other severe frontotemporal dementia, unspecified whether behavioral, psychotic, or mood disturbance or anxiety (HCC) [G31.09, F02.C0] Patient Active Problem List   Diagnosis Date Noted   Acute on chronic respiratory failure with hypoxia and hypercapnia (HCC) 09/12/2021   Acute bacterial conjunctivitis of right eye 09/12/2021   Frontotemporal dementia  with behavioral disturbance (HCC) 05/23/2019   Hypothyroid     High cholesterol    Tinnitus 02/05/2018   Gastroesophageal reflux disease 05/09/2016   Elevated blood-pressure reading without diagnosis of hypertension 05/09/2016   Impaired fasting glucose 05/09/2016   Mixed dyslipidemia 05/09/2016   Vitamin D deficiency 05/09/2016   Osteoporosis, postmenopausal 05/09/2016   PCP:  System, Provider Not In Pharmacy:   [facility] - [place], [place] - [address] [place] [address] Phone: [phone] Fax: [phone]     Social Determinants of Health (SDOH) Interventions    Readmission Risk Interventions     No data to display

## 2021-09-12 NOTE — H&P (Signed)
But History and Physical    Patient: Kaitlyn Bryant TKZ:[id number] DOB: 1945/07/20 DOA: 09/12/2021 DOS: the patient was seen and examined on 09/12/2021 PCP: System, Provider Not In  Patient coming from: Home  Chief Complaint:  Chief Complaint  Patient presents with   Altered Mental Status   HPI: SYNA GAD is a 76 y.o. female with medical history significant of frontotemporal dementia, hyperlipidemia, hypothyroidism, glucose intolerance who was sent from her nursing facility due to altered mental status and hypoxia.  She is unable to provide further history at this time.  According to her husband, she was more somnolent earlier, but is back to baseline now.  Her husband stated that she is usually bed or wheelchair bound.  She was at her baseline yesterday when he last saw her.  ED course: Initial vital signs were temperature 98.9 F, pulse 74, respiration 18, BP 124/42 mmHg O2 sat 100% on 2 LPM via nasal cannula.  The patient received cefepime and vancomycin.  Her oxygen requirement has gone up from 2 LPM to 5-6 LPM.  Lab work: Urinalysis was cloudy, it showed ketonuria 5 mg deciliter, small leukocyte esterase and rare bacteria on microscopic examination.  Her CBC was normal.  BMP with a CO2 of 39 mmol/L, the rest of the BMP numbers were normal.  Imaging: A portable 1 view chest radiograph showed low chest volume with atelectasis or infiltrate at the bases.  CT head without contrast with no evidence of acute intracranial abnormality.   Review of Systems: As mentioned in the history of present illness. All other systems reviewed and are negative. Past Medical History:  Diagnosis Date   Dementia (HCC)    High cholesterol    Hypothyroid    Past Surgical History:  Procedure Laterality Date   NO PAST SURGERIES     Social History:  reports that she has never smoked. She has never used smokeless tobacco. She reports that she does not drink alcohol and does not use drugs.  Allergies   Allergen Reactions   Methimazole Anaphylaxis   Sulfamethoxazole Shortness Of Breath   Alendronate Other (See Comments)    Gastroesophageal reflux    Ibandronic Acid Other (See Comments)    Gastroesophageal reflux   Risedronate Other (See Comments)    Gastroesophageal reflux    Iodine Other (See Comments)    Rash    Amoxicillin-Pot Clavulanate Rash   Aspirin Other (See Comments)    unknown   Atorvastatin Other (See Comments)    Malaise    Colesevelam    Ezetimibe Other (See Comments)    Rash    Fluvastatin Other (See Comments)    myalgias   Niacin Rash   Other Other (See Comments)    unknown   Peppermint Flavor Other (See Comments)    GI upset    Pitavastatin Other (See Comments)    Gastroesophageal reflux and myalgias    Raloxifene Other (See Comments)    migraines   Rosuvastatin Other (See Comments)    myalgias and weakness on 5 mg every other day   Shellfish Allergy Swelling and Rash    Family History  Problem Relation Age of Onset   Liver disease Mother    Dementia Mother    Hypothyroidism Sister    Liver cancer Sister    Cancer Sister        splenic lymphoma   Cirrhosis Sister        alcoholism    Prior to Admission medications   Medication  Sig Start Date End Date Taking? Authorizing Provider  BENZTROPINE MESYLATE PO Take 0.5 mg by mouth at bedtime.    [provider]  BIOTIN 5000 PO Take 5,000 mcg by mouth daily.    [provider]  Cholecalciferol (VITAMIN D3 PO) Take by mouth daily.    [provider]  Coenzyme Q10 (CO Q 10 PO) Take 100 mg by mouth daily.    [provider]  divalproex (DEPAKOTE ER) 250 MG 24 hr tablet Take 250 mg by mouth in the morning and at bedtime.    [provider]  levothyroxine (SYNTHROID) 75 MCG tablet TAKE 1 TABLET IN THE MORNING ON EMPTY STOMACH FOR THYROID 02/09/18   [provider]  LORazepam (ATIVAN) 0.5 MG tablet Take 0.5 mg by mouth 2 (two) times daily.    [provider]  Multiple Vitamin (MULTI-VITAMIN) tablet Take 1 tablet once a day    [provider]  Multiple Vitamins-Minerals (PRESERVISION AREDS 2 PO) Take by mouth daily.    [provider]  polyethylene glycol (MIRALAX / GLYCOLAX) 17 g packet Take 17 g by mouth daily.    [provider]  QUEtiapine (SEROQUEL XR) 50 MG TB24 24 hr tablet Take 50 mg by mouth in the morning and at bedtime.    [provider]  REPATHA SURECLICK 140 MG/ML SOAJ Inject 1 mL into the skin every 14 (fourteen) days. 11/01/18   [provider]  sertraline (ZOLOFT) 50 MG tablet Take 50 mg by mouth daily.    [provider]    Physical Exam: Vitals:   09/12/21 0358 09/12/21 0404 09/12/21 0857  BP: (!) 124/42  (!) 152/97  Pulse: 74  84  Resp: 18  18  Temp: 98.9 F (37.2 C)  98.1 F (36.7 C)  TempSrc: Oral  Oral  SpO2: 100% 100% 90%   Physical Exam Vitals and nursing note reviewed.  Constitutional:      General: She is awake. She is not in acute distress.    Appearance: Normal appearance. She is not toxic-appearing.     Interventions: Nasal cannula in place.  HENT:     Head: Normocephalic.     Mouth/Throat:     Mouth: Mucous membranes are moist.  Eyes:     General: No scleral icterus.    Pupils: Pupils are equal, round, and reactive to light.  Neck:     Vascular: No JVD.  Cardiovascular:     Rate and Rhythm: Normal rate and regular rhythm.     Heart sounds: S1 normal and S2 normal.  Pulmonary:     Effort: Pulmonary effort is normal. No accessory muscle usage or retractions.     Breath sounds: Examination of the right-lower field reveals decreased breath sounds and rales. Examination of the left-lower field reveals decreased breath sounds. Decreased breath sounds and rales present. No wheezing or rhonchi.  Abdominal:     General: Bowel sounds are normal. There is no distension.     Palpations: Abdomen is soft.     Tenderness: There is no abdominal  tenderness. There is no guarding.  Musculoskeletal:     Cervical back: Neck supple.     Right lower leg: No edema.     Left lower leg: No edema.  Skin:    General: Skin is warm and dry.  Neurological:     General: No focal deficit present.     Mental Status: She is alert. Mental status is at baseline. She is  disoriented.  Psychiatric:        Mood and Affect: Mood normal.        Behavior: Behavior normal.   Data Reviewed:  There are no new results to review at this time.  Assessment and Plan: Principal Problem:   Acute on chronic respiratory failure with hypoxia and hypercapnia (HCC) In the setting of:   Community acquired bilateral lower lobe pneumonia Admit to PCU/inpatient. BiPAP as needed and nightly. Hold lorazepam twice daily. Continue supplemental oxygen. Scheduled and as needed bronchodilators. Continue ceftriaxone 1 g IVPB daily. Continue azithromycin 500 mg IVPB daily. Check strep pneumoniae urinary antigen. Check sputum Gram stain, culture and sensitivity. Follow-up blood culture and sensitivity. Follow-up CBC and chemistry in the morning. Follow-up venous blood gas in the morning.  Active Problems:   Acute bacterial conjunctivitis of right eye Continue antibiotic coverage as above. Ciprofloxacin drops.    Hypothyroid Continue levothyroxine 75 mcg p.o. daily.    Frontotemporal dementia with behavioral disturbance (HCC) Continue Seroquel 50 mg p.o. bedtime. Continue sertraline 50 mg p.o. daily. Continue Depakote 250 mg p.o. twice daily. Hold lorazepam to avoid sedating effects.    Gastroesophageal reflux disease Famotidine twice daily.    Impaired fasting glucose Monitor fasting glucose.    Mixed dyslipidemia Currently not on medical therapy. Follow-up with primary.     Advance Care Planning:   Code Status: DNR   Consults:   Family Communication: Her husband was at bedside.  Severity of Illness: The appropriate patient status for this  patient is INPATIENT. Inpatient status is judged to be reasonable and necessary in order to provide the required intensity of service to ensure the patient's safety. The patient's presenting symptoms, physical exam findings, and initial radiographic and laboratory data in the context of their chronic comorbidities is felt to place them at high risk for further clinical deterioration. Furthermore, it is not anticipated that the patient will be medically stable for discharge from the hospital within 2 midnights of admission.   * I certify that at the point of admission it is my clinical judgment that the patient will require inpatient hospital care spanning beyond 2 midnights from the point of admission due to high intensity of service, high risk for further deterioration and high frequency of surveillance required.*  Author: Bobette Mo, MD 09/12/2021 2:27 PM  For on call review [email or web address].   This document was prepared using Dragon voice recognition software and may contain some unintended transcription errors.

## 2021-09-12 NOTE — ED Triage Notes (Signed)
Patient BIB EMS for evaluation of "not acting right."  Per facility staff, "patient had low oxygen saturations all day and was not acting right."  Unable to obtain baseline "because she is always asleep at night."  Patient has no complaints.  Nonverbal at baseline

## 2021-09-12 NOTE — ED Provider Notes (Signed)
[facility]-EMERGENCY DEPT Provider Note  CSN: [id number] Arrival date & time: 09/12/21 0347  Chief Complaint(s) Altered Mental Status  HPI Kaitlyn Bryant is a 76 y.o. female presenting to the emergency department with low oxygen.  Reportedly from nursing home, patient with low oxygen and seemed less active than normal. patient was brought to the emergency department.  Unclear whether or not patient had other symptoms such as fevers, cough, vomiting, diarrhea.  I attempted to call nursing home multiple times without response.  Patient's husband is at bedside, reports that patient seems grossly at baseline to him today.  History otherwise limited by dementia   Past Medical History Past Medical History:  Diagnosis Date   Dementia (HCC)    High cholesterol    Hypothyroid    Patient Active Problem List   Diagnosis Date Noted   Acute on chronic respiratory failure with hypoxia (HCC) 09/12/2021   Frontotemporal dementia with behavioral disturbance (HCC) 05/23/2019   Hypothyroid    High cholesterol    Home Medication(s) Prior to Admission medications   Medication Sig Start Date End Date Taking? Authorizing Provider  BENZTROPINE MESYLATE PO Take 0.5 mg by mouth at bedtime.    [provider]  BIOTIN 5000 PO Take 5,000 mcg by mouth daily.    [provider]  Cholecalciferol (VITAMIN D3 PO) Take by mouth daily.    [provider]  Coenzyme Q10 (CO Q 10 PO) Take 100 mg by mouth daily.    [provider]  divalproex (DEPAKOTE ER) 250 MG 24 hr tablet Take 250 mg by mouth in the morning and at bedtime.    [provider]  levothyroxine (SYNTHROID) 75 MCG tablet TAKE 1 TABLET IN THE MORNING ON EMPTY STOMACH FOR THYROID 02/09/18   [provider]  LORazepam (ATIVAN) 0.5 MG tablet Take 0.5 mg by mouth 2 (two) times daily.    [provider]  Multiple Vitamin (MULTI-VITAMIN) tablet Take 1 tablet once a day     [provider]  Multiple Vitamins-Minerals (PRESERVISION AREDS 2 PO) Take by mouth daily.    [provider]  polyethylene glycol (MIRALAX / GLYCOLAX) 17 g packet Take 17 g by mouth daily.    [provider]  QUEtiapine (SEROQUEL XR) 50 MG TB24 24 hr tablet Take 50 mg by mouth in the morning and at bedtime.    [provider]  REPATHA SURECLICK 140 MG/ML SOAJ Inject 1 mL into the skin every 14 (fourteen) days. 11/01/18   [provider]  sertraline (ZOLOFT) 50 MG tablet Take 50 mg by mouth daily.    [provider]                                                                                                                                    Past Surgical History Past Surgical History:  Procedure Laterality Date   NO PAST SURGERIES  Family History Family History  Problem Relation Age of Onset   Liver disease Mother    Dementia Mother    Hypothyroidism Sister    Liver cancer Sister    Cancer Sister        splenic lymphoma   Cirrhosis Sister        alcoholism    Social History Social History   Tobacco Use   Smoking status: Never   Smokeless tobacco: Never  Vaping Use   Vaping Use: Never used  Substance Use Topics   Alcohol use: Never   Drug use: Never   Allergies Methimazole, Sulfamethoxazole, Alendronate, Ibandronic acid, Risedronate, Iodine, Amoxicillin-pot clavulanate, Aspirin, Atorvastatin, Colesevelam, Ezetimibe, Fluvastatin, Niacin, Other, Peppermint flavor, Pitavastatin, Raloxifene, Rosuvastatin, and Shellfish allergy  Review of Systems Review of Systems  Unable to perform ROS: Dementia    Physical Exam Vital Signs  I have reviewed the triage vital signs BP (!) 152/97 (BP Location: Right Arm)   Pulse 84   Temp 98.1 F (36.7 C) (Oral)   Resp 18   SpO2 90%   Physical Exam Vitals and nursing note reviewed.  Constitutional:      General: She is not in acute distress.    Appearance: She is  well-developed.  HENT:     Head: Normocephalic and atraumatic.     Mouth/Throat:     Mouth: Mucous membranes are moist.  Eyes:     Pupils: Pupils are equal, round, and reactive to light.  Cardiovascular:     Rate and Rhythm: Normal rate and regular rhythm.     Heart sounds: No murmur heard. Pulmonary:     Effort: Pulmonary effort is normal. No respiratory distress.     Breath sounds: Rales (left basilar) present.  Abdominal:     General: Abdomen is flat.     Palpations: Abdomen is soft.     Tenderness: There is no abdominal tenderness.  Musculoskeletal:        General: No tenderness.     Right lower leg: No edema.     Left lower leg: No edema.  Skin:    General: Skin is warm and dry.  Neurological:     Mental Status: She is alert.     Comments: Appears to be at baseline per husband, tracks me around room, no spontaneous speech, does not follow commands  Psychiatric:     Comments: Unable to assess     ED Results and Treatments Labs (all labs ordered are listed, but only abnormal results are displayed) Labs Reviewed  BASIC METABOLIC PANEL - Abnormal; Notable for the following components:      Result Value   CO2 39 (*)    All other components within normal limits  URINALYSIS, ROUTINE W REFLEX MICROSCOPIC - Abnormal; Notable for the following components:   APPearance CLOUDY (*)    Ketones, ur 5 (*)    Leukocytes,Ua SMALL (*)    Bacteria, UA RARE (*)    All other components within normal limits  CULTURE, BLOOD (ROUTINE X 2)  CULTURE, BLOOD (ROUTINE X 2)  SARS CORONAVIRUS 2 BY RT PCR  CBC WITH DIFFERENTIAL/PLATELET  Radiology DG CHEST PORT 1 VIEW  Result Date: 09/12/2021 CLINICAL DATA:  Hypoxia EXAM: PORTABLE CHEST 1 VIEW COMPARISON:  None Available. FINDINGS: Low volume chest with indistinct opacity at the bases. Small volume pleural effusion is  possible. Enlarged appearance of the heart. No pneumothorax. IMPRESSION: Low volume chest with atelectasis or infiltrate at the bases. Electronically Signed   By: Tiburcio Pea M.D.   On: 09/12/2021 10:54   CT Head Wo Contrast  Result Date: 09/12/2021 CLINICAL DATA:  Mental status changes.  Low oxygen saturation. EXAM: CT HEAD WITHOUT CONTRAST TECHNIQUE: Contiguous axial images were obtained from the base of the skull through the vertex without intravenous contrast. RADIATION DOSE REDUCTION: This exam was performed according to the departmental dose-optimization program which includes automated exposure control, adjustment of the mA and/or kV according to patient size and/or use of iterative reconstruction technique. COMPARISON:  12/21/2018 FINDINGS: Brain: There is central and cortical atrophy. Periventricular white matter changes are consistent with small vessel disease. There is no intra or extra-axial fluid collection or mass lesion. Basilar cisterns are normal. There is increased prominence of sulci and ventricles compared with the prior study. There is no CT evidence for acute infarction or hemorrhage. Vascular: There is atherosclerotic calcification of the internal carotid arteries. No hyperdense vessel. Skull: There is atherosclerotic calcification of the internal carotid arteries. No hyperdense vessel. Sinuses/Orbits: No acute finding. Other: None. IMPRESSION: 1. Increased prominence of ventricles and sulci, consistent with increased atrophy. 2.  No evidence for acute intracranial abnormality. Electronically Signed   By: Norva Pavlov M.D.   On: 09/12/2021 08:53    Pertinent labs & imaging results that were available during my care of the patient were reviewed by me and considered in my medical decision making (see MDM for details).  Medications Ordered in ED Medications  ceFEPIme (MAXIPIME) 2 g in sodium chloride 0.9 % 100 mL IVPB (2 g Intravenous New Bag/Given 09/12/21 1226)  vancomycin  (VANCOCIN) IVPB 1000 mg/200 mL premix (has no administration in time range)                                                                                                                                     Procedures .Critical Care  Performed by: Lonell Grandchild, MD Authorized by: Lonell Grandchild, MD   Critical care provider statement:    Critical care time (minutes):  30   Critical care was necessary to treat or prevent imminent or life-threatening deterioration of the following conditions:  Respiratory failure, sepsis, cardiac failure and circulatory failure   Critical care was time spent personally by me on the following activities:  Development of treatment plan with patient or surrogate, discussions with consultants, evaluation of patient's response to treatment, examination of patient, ordering and review of laboratory studies, ordering and review of radiographic studies, ordering and performing treatments and interventions, pulse oximetry, re-evaluation of patient's condition and review of old  charts   (including critical care time)  Medical Decision Making / ED Course   This patient presents to the ED for concern of altered mental status, this involves an extensive number of treatment options, and is a complaint that carries with it a high risk of complications and morbidity.  The differential diagnosis includes sepsis, pneumonia, electrolyte derangement, intracranial bleeding, intracranial tumor, vital sign abnormality  MDM:  76 year old female presenting from nursing home for abnormal behavior.  Patient noted to be hypoxic in emergency department, decreased to home oxygen of 2 L, patient became hypoxic to low 80s.  Increased back up to 4 L with resolution.  Per husband, patient only on 2 L at home secondary to previous pneumonia.  Patient does have left basilar lung sounds and x-ray findings concerning for new pneumonia.  This would also explain her reported decreased  activity.  Urinalysis somewhat suspicious for UTI but not severely so.  Labs otherwise reassuring.  CT head obtained without evidence of intracranial injury and per husband, mental status at baseline.  Given new oxygen requirement, will admit, discussed with hospitalist to admit the patient.  Attempted to discuss with nursing home but no answer on phone line.      Additional history obtained: -Additional history obtained from husband -External records from outside source obtained and reviewed including: Chart review including previous notes, labs, imaging, consultation notes   Lab Tests: -I ordered, reviewed, and interpreted labs.   The pertinent results include:   Labs Reviewed  BASIC METABOLIC PANEL - Abnormal; Notable for the following components:      Result Value   CO2 39 (*)    All other components within normal limits  URINALYSIS, ROUTINE W REFLEX MICROSCOPIC - Abnormal; Notable for the following components:   APPearance CLOUDY (*)    Ketones, ur 5 (*)    Leukocytes,Ua SMALL (*)    Bacteria, UA RARE (*)    All other components within normal limits  CULTURE, BLOOD (ROUTINE X 2)  CULTURE, BLOOD (ROUTINE X 2)  SARS CORONAVIRUS 2 BY RT PCR  CBC WITH DIFFERENTIAL/PLATELET      Imaging Studies ordered: I ordered imaging studies including  I independently visualized and interpreted imaging. I agree with the radiologist interpretation   Medicines ordered and prescription drug management: Meds ordered this encounter  Medications   ceFEPIme (MAXIPIME) 2 g in sodium chloride 0.9 % 100 mL IVPB    Order Specific Question:   Antibiotic Indication:    Answer:   HCAP   vancomycin (VANCOCIN) IVPB 1000 mg/200 mL premix    Order Specific Question:   Indication:    Answer:   Pneumonia    -I have reviewed the patients home medicines and have made adjustments as needed   Consultations Obtained: I requested consultation with the hospitalist,  and discussed lab and imaging findings  as well as pertinent plan - they recommend: admission   Cardiac Monitoring: The patient was maintained on a cardiac monitor.  I personally viewed and interpreted the cardiac monitored which showed an underlying rhythm of: normal sinus rhythm  Social Determinants of Health:  Factors impacting patients care include: dementia   Reevaluation: After the interventions noted above, I reevaluated the patient and found that they have :improved  Co morbidities that complicate the patient evaluation  Past Medical History:  Diagnosis Date   Dementia (HCC)    High cholesterol    Hypothyroid       Dispostion: I considered admission for this patient, and  will admit for hypoxia and suspected pneumonia.      Final Clinical Impression(s) / ED Diagnoses Final diagnoses:  Healthcare-associated pneumonia  Other severe frontotemporal dementia, unspecified whether behavioral, psychotic, or mood disturbance or anxiety (HCC)     This chart was dictated using voice recognition software.  Despite best efforts to proofread,  errors can occur which can change the documentation meaning.    Lonell Grandchild, MD 09/12/21 1249

## 2021-09-12 NOTE — ED Provider Triage Note (Signed)
Emergency Medicine Provider Triage Evaluation Note  Kaitlyn Bryant , a 76 y.o. female  was evaluated in triage.  Pt complains of low saturation not on oxygen.  Then back to her normal on her prescribed O2 settings   Review of Systems  Level 5 caveat due to non verbal dementia     Physical Exam  BP (!) 124/42 (BP Location: Left Arm)   Pulse 74   Temp 98.9 F (37.2 C) (Oral)   Resp 18   SpO2 100%  Gen:   Awake, no distress   Resp:  Normal effort  MSK:   Moves extremities without difficulty  Other: RRR  Medical Decision Making  Medically screening exam initiated at 4:25 AM.  Appropriate orders placed.  Kaitlyn Bryant was informed that the remainder of the evaluation will be completed by another provider, this initial triage assessment does not replace that evaluation, and the importance of remaining in the ED until their evaluation is complete.    Kaitlyn Geeslin, MD 09/12/21 [phone]

## 2021-09-12 NOTE — Progress Notes (Signed)
A consult was received from an ED physician for vanc per pharmacy dosing.  The patient's profile has been reviewed for ht/wt/allergies/indication/available labs.   A one time order has been placed for vanc 1g.  Further antibiotics/pharmacy consults should be ordered by admitting physician if indicated.                       Thank you, Berkley Harvey 09/12/2021  11:40 AM

## 2021-09-13 ENCOUNTER — Inpatient Hospital Stay (HOSPITAL_COMMUNITY): Payer: Medicare HMO

## 2021-09-13 DIAGNOSIS — J189 Pneumonia, unspecified organism: Secondary | ICD-10-CM | POA: Diagnosis not present

## 2021-09-13 DIAGNOSIS — J9621 Acute and chronic respiratory failure with hypoxia: Secondary | ICD-10-CM | POA: Diagnosis not present

## 2021-09-13 DIAGNOSIS — G3109 Other frontotemporal dementia: Secondary | ICD-10-CM | POA: Diagnosis not present

## 2021-09-13 DIAGNOSIS — F02818 Dementia in other diseases classified elsewhere, unspecified severity, with other behavioral disturbance: Secondary | ICD-10-CM

## 2021-09-13 DIAGNOSIS — H1031 Unspecified acute conjunctivitis, right eye: Secondary | ICD-10-CM

## 2021-09-13 DIAGNOSIS — E782 Mixed hyperlipidemia: Secondary | ICD-10-CM

## 2021-09-13 DIAGNOSIS — K219 Gastro-esophageal reflux disease without esophagitis: Secondary | ICD-10-CM

## 2021-09-13 DIAGNOSIS — E039 Hypothyroidism, unspecified: Secondary | ICD-10-CM

## 2021-09-13 DIAGNOSIS — R7301 Impaired fasting glucose: Secondary | ICD-10-CM

## 2021-09-13 LAB — STREP PNEUMONIAE URINARY ANTIGEN: Strep Pneumo Urinary Antigen: NEGATIVE

## 2021-09-13 LAB — BLOOD GAS, VENOUS
Acid-Base Excess: 16.2 mmol/L — ABNORMAL HIGH (ref 0.0–2.0)
Bicarbonate: 42.9 mmol/L — ABNORMAL HIGH (ref 20.0–28.0)
O2 Saturation: 78 %
Patient temperature: 37
pCO2, Ven: 59 mmHg (ref 44–60)
pH, Ven: 7.47 — ABNORMAL HIGH (ref 7.25–7.43)
pO2, Ven: 43 mmHg (ref 32–45)

## 2021-09-13 LAB — CBC
HCT: 39.3 % (ref 36.0–46.0)
Hemoglobin: 12.1 g/dL (ref 12.0–15.0)
MCH: 29.8 pg (ref 26.0–34.0)
MCHC: 30.8 g/dL (ref 30.0–36.0)
MCV: 96.8 fL (ref 80.0–100.0)
Platelets: 155 10*3/uL (ref 150–400)
RBC: 4.06 MIL/uL (ref 3.87–5.11)
RDW: 14.4 % (ref 11.5–15.5)
WBC: 6.4 10*3/uL (ref 4.0–10.5)
nRBC: 0 % (ref 0.0–0.2)

## 2021-09-13 LAB — COMPREHENSIVE METABOLIC PANEL
ALT: 20 U/L (ref 0–44)
AST: 18 U/L (ref 15–41)
Albumin: 2.8 g/dL — ABNORMAL LOW (ref 3.5–5.0)
Alkaline Phosphatase: 50 U/L (ref 38–126)
Anion gap: 5 (ref 5–15)
BUN: 16 mg/dL (ref 8–23)
CO2: 38 mmol/L — ABNORMAL HIGH (ref 22–32)
Calcium: 9.2 mg/dL (ref 8.9–10.3)
Chloride: 100 mmol/L (ref 98–111)
Creatinine, Ser: 0.82 mg/dL (ref 0.44–1.00)
GFR, Estimated: >60 mL/min (ref >60)
Glucose, Bld: 94 mg/dL (ref 70–99)
Potassium: 3.9 mmol/L (ref 3.5–5.1)
Sodium: 143 mmol/L (ref 135–145)
Total Bilirubin: 0.5 mg/dL (ref 0.3–1.2)
Total Protein: 5.9 g/dL — ABNORMAL LOW (ref 6.5–8.1)

## 2021-09-13 MED ORDER — IPRATROPIUM BROMIDE 0.02 % IN SOLN
0.5000 mg | Freq: Four times a day (QID) | RESPIRATORY_TRACT | Status: DC
  Administered 2021-09-13 – 2021-09-14 (×6): 0.5 mg via RESPIRATORY_TRACT
  Filled 2021-09-13 (×6): qty 2.5

## 2021-09-13 MED ORDER — AZITHROMYCIN 250 MG PO TABS
500.0000 mg | ORAL_TABLET | Freq: Every day | ORAL | Status: DC
  Administered 2021-09-14: 500 mg via ORAL
  Filled 2021-09-13: qty 2

## 2021-09-13 MED ORDER — LEVALBUTEROL HCL 0.63 MG/3ML IN NEBU
0.6300 mg | INHALATION_SOLUTION | Freq: Four times a day (QID) | RESPIRATORY_TRACT | Status: DC
  Administered 2021-09-13 – 2021-09-14 (×6): 0.63 mg via RESPIRATORY_TRACT
  Filled 2021-09-13 (×6): qty 3

## 2021-09-13 MED ORDER — GUAIFENESIN ER 600 MG PO TB12
1200.0000 mg | ORAL_TABLET | Freq: Two times a day (BID) | ORAL | Status: DC
  Administered 2021-09-13 – 2021-09-14 (×3): 1200 mg via ORAL
  Filled 2021-09-13 (×3): qty 2

## 2021-09-13 NOTE — Progress Notes (Signed)
PROGRESS NOTE    Kaitlyn Bryant  NIO:[id number] DOB: 1945/10/01 DOA: 09/12/2021 PCP: System, Provider Not In   Brief Narrative:  HPI per Dr. Sanda Klein on 09/12/21 Kaitlyn Bryant is a 76 y.o. female with medical history significant of frontotemporal dementia, hyperlipidemia, hypothyroidism, glucose intolerance who was sent from her nursing facility due to altered mental status and hypoxia.  She is unable to provide further history at this time.  According to her husband, she was more somnolent earlier, but is back to baseline now.  Her husband stated that she is usually bed or wheelchair bound.  She was at her baseline yesterday when he last saw her.   ED course: Initial vital signs were temperature 98.9 F, pulse 74, respiration 18, BP 124/42 mmHg O2 sat 100% on 2 LPM via nasal cannula.  The patient received cefepime and vancomycin.  Her oxygen requirement has gone up from 2 LPM to 5-6 LPM.   Lab work: Urinalysis was cloudy, it showed ketonuria 5 mg deciliter, small leukocyte esterase and rare bacteria on microscopic examination.  Her CBC was normal.  BMP with a CO2 of 39 mmol/L, the rest of the BMP numbers were normal.   Imaging: A portable 1 view chest radiograph showed low chest volume with atelectasis or infiltrate at the bases.  CT head without contrast with no evidence of acute intracranial abnormality.  **Interim History Per the patient's husband she is doing much better.  Patient is currently nonverbal given her advanced dementia.  Patient's husband states that she has not been coughing but he thinks that she is doing much better and if she remains stable likely can be discharged in the next 5 4 to 48 hours  Assessment and Plan:    Acute on chronic respiratory failure with hypoxia and hypercapnia (HCC) In the setting of:   Community acquired bilateral lower lobe pneumonia -SpO2: 95 % O2 Flow Rate (L/min): 3 L/min FiO2 (%): 40 % some: Her baseline is 2 L at home -Admit to  PCU/inpatient.  And is improving -BiPAP as needed and nightly. Hold lorazepam twice daily. -Continue supplemental oxygen. -Scheduled and as needed bronchodilators.  Changed to Xopenex and Atrovent today -Continue ceftriaxone 1 g IVPB daily. -Continue azithromycin 500 mg IVPB daily. -Check strep pneumoniae urinary antigen. Check sputum Gram stain, culture and sensitivity. -Follow-up blood culture and sensitivity. -ABG    Component Value Date/Time   HCO3 42.9 (H) 09/13/2021 0543   O2SAT 78 09/13/2021 0543   -Follow-up CBC and chemistry in the morning. -Repeat chest x-ray done and showed "Stable mild bibasilar atelectasis or infiltrates are noted with small pleural effusions."    Acute bacterial conjunctivitis of right eye -Continue antibiotic coverage as above. -Ciprofloxacin drops.     Hypothyroidism -Continue levothyroxine 75 mcg p.o. daily.   Advanced frontotemporal dementia with behavioral disturbance (HCC) -Continue Seroquel 50 mg p.o. bedtime. -Continue sertraline 50 mg p.o. daily. -Continue Depakote 250 mg p.o. twice daily. Hold lorazepam to avoid sedating effects and can likely resume some.   Gastroesophageal reflux disease/GI prophylaxis -Famotidine twice daily.   Impaired fasting glucose -Monitor fasting glucose.    Mixed dyslipidemia Currently not on medical therapy. Follow-up with primary in outpatient setting   Hypoalbuminemia -Patient's albumin level is now 2.8 -Continue monitor and trend and repeat CMP in a.m.  DVT prophylaxis: enoxaparin (LOVENOX) injection 40 mg Start: 09/12/21 2200    Code Status: DNR Family Communication: Discussed with husband at bedside  Disposition Plan:  Level of care:  Progressive Status is: Inpatient Remains inpatient appropriate because: She continues to improve slowly and if stable can likely be discharged in next 24 to 48 hours   Consultants:  None  Procedures:  None  Antimicrobials:  Anti-infectives (From  admission, onward)    Start     Dose/Rate Route Frequency Ordered Stop   09/14/21 1000  azithromycin (ZITHROMAX) tablet 500 mg        500 mg Oral Daily 09/13/21 1414 09/17/21 0959   09/12/21 2000  cefTRIAXone (ROCEPHIN) 1 g in sodium chloride 0.9 % 100 mL IVPB        1 g 200 mL/hr over 30 Minutes Intravenous Every 24 hours 09/12/21 1351     09/12/21 1400  azithromycin (ZITHROMAX) 500 mg in sodium chloride 0.9 % 250 mL IVPB  Status:  Discontinued        500 mg 250 mL/hr over 60 Minutes Intravenous Every 24 hours 09/12/21 1351 09/13/21 1414   09/12/21 1145  vancomycin (VANCOCIN) IVPB 1000 mg/200 mL premix        1,000 mg 200 mL/hr over 60 Minutes Intravenous  Once 09/12/21 1141 09/12/21 1811   09/12/21 1130  ceFEPIme (MAXIPIME) 2 g in sodium chloride 0.9 % 100 mL IVPB        2 g 200 mL/hr over 30 Minutes Intravenous  Once 09/12/21 1128 09/12/21 1811       Subjective: Seen and examined at bedside and she is nonverbal so history is not able to be obtained from the patient but the patient's husband is at bedside and he feels that she is about 95% better and closer to her baseline.  Patient has not been coughing and husband states that she has not been able to cough up anything yet.  Denies any other concerns or plaints at this time.  Objective: Vitals:   09/13/21 0422 09/13/21 1326 09/13/21 1347 09/13/21 1536  BP:  134/72    Pulse:  74    Resp:  16    Temp: 98.6 F (37 C) 100.2 F (37.9 C)  97.8 F (36.6 C)  TempSrc: Oral Axillary  Axillary  SpO2:  92% 95%   Weight:      Height:        Intake/Output Summary (Last 24 hours) at 09/13/2021 1921 Last data filed at 09/13/2021 0818 Gross per 24 hour  Intake 460 ml  Output 50 ml  Net 410 ml   Filed Weights   09/12/21 1624  Weight: 76.9 kg   Examination: Physical Exam:  Constitutional: WN/WD chronically ill-appearing elderly Caucasian female who is pleasantly demented and nonverbal given the advanced dementia Respiratory:  Diminished to auscultation bilaterally with coarse breath sounds, no wheezing, rales, rhonchi or crackles. Normal respiratory effort and patient is not tachypenic. No accessory muscle use.  Wearing supplemental oxygen via nasal cannula Cardiovascular: RRR, no murmurs / rubs / gallops. S1 and S2 auscultated.  No appreciable extremity edema Abdomen: Soft, non-tender, slightly tender to palpate.  Bowel sounds positive.  GU: Deferred. Musculoskeletal: No clubbing / cyanosis of digits/nails. No joint deformity upper and lower extremities..  Skin: No rashes, lesions, ulcers on limited skin evaluation. No induration; Warm and dry.  Neurologic: She is nonverbal but able to track with her eyes and has significantly advanced dementia Psychiatric: Impaired judgment and insight.  She appears awake and alert.  Data Reviewed: I have personally reviewed following labs and imaging studies  CBC: Recent Labs  Lab 09/12/21 0600 09/13/21 0543  WBC 8.2 6.4  NEUTROABS 6.0  --   HGB 13.0 12.1  HCT 42.9 39.3  MCV 98.4 96.8  PLT 184 155   Basic Metabolic Panel: Recent Labs  Lab 09/12/21 0600 09/13/21 0543  NA 144 143  K 4.4 3.9  CL 99 100  CO2 39* 38*  GLUCOSE 78 94  BUN 21 16  CREATININE 0.89 0.82  CALCIUM 9.7 9.2   GFR: Estimated Creatinine Clearance: 62.7 mL/min (by C-G formula based on SCr of 0.82 mg/dL). Liver Function Tests: Recent Labs  Lab 09/13/21 0543  AST 18  ALT 20  ALKPHOS 50  BILITOT 0.5  PROT 5.9*  ALBUMIN 2.8*   No results for input(s): "LIPASE", "AMYLASE" in the last 168 hours. No results for input(s): "AMMONIA" in the last 168 hours. Coagulation Profile: No results for input(s): "INR", "PROTIME" in the last 168 hours. Cardiac Enzymes: No results for input(s): "CKTOTAL", "CKMB", "CKMBINDEX", "TROPONINI" in the last 168 hours. BNP (last 3 results) No results for input(s): "PROBNP" in the last 8760 hours. HbA1C: No results for input(s): "HGBA1C" in the last 72  hours. CBG: No results for input(s): "GLUCAP" in the last 168 hours. Lipid Profile: No results for input(s): "CHOL", "HDL", "LDLCALC", "TRIG", "CHOLHDL", "LDLDIRECT" in the last 72 hours. Thyroid Function Tests: No results for input(s): "TSH", "T4TOTAL", "FREET4", "T3FREE", "THYROIDAB" in the last 72 hours. Anemia Panel: No results for input(s): "VITAMINB12", "FOLATE", "FERRITIN", "TIBC", "IRON", "RETICCTPCT" in the last 72 hours. Sepsis Labs: No results for input(s): "PROCALCITON", "LATICACIDVEN" in the last 168 hours.  Recent Results (from the past 240 hour(s))  Blood culture (routine x 2)     Status: None (Preliminary result)   Collection Time: 09/12/21 11:48 AM   Specimen: BLOOD RIGHT HAND  Result Value Ref Range Status   Specimen Description   Final    BLOOD RIGHT HAND Performed at [facility], [address] [place], [place] [address]    Special Requests   Final    BOTTLES DRAWN AEROBIC AND ANAEROBIC Blood Culture adequate volume Performed at [facility], [address] [place], [place] [address]    Culture   Final    NO GROWTH < 24 HOURS Performed at [facility], [address] [place], [place] [address]    Report Status PENDING  Incomplete  Blood culture (routine x 2)     Status: None (Preliminary result)   Collection Time: 09/12/21 12:27 PM   Specimen: BLOOD  Result Value Ref Range Status   Specimen Description   Final    BLOOD SITE NOT SPECIFIED Performed at [facility], [address] [place], [place] [address]    Special Requests   Final    BOTTLES DRAWN AEROBIC AND ANAEROBIC Blood Culture adequate volume Performed at [facility], [address] [place], [place] [address]    Culture   Final    NO GROWTH < 24 HOURS Performed at [facility], [address] [place], [place] [address]    Report Status PENDING  Incomplete  SARS Coronavirus 2 by RT PCR (hospital  order, performed in [facility] hospital lab) *cepheid single result test* Anterior Nasal Swab     Status: None   Collection Time: 09/12/21  4:38 PM   Specimen: Anterior Nasal Swab  Result Value Ref Range Status   SARS Coronavirus 2 by RT PCR NEGATIVE NEGATIVE Final    Comment: (NOTE) SARS-CoV-2 target nucleic acids are NOT DETECTED.  The SARS-CoV-2  RNA is generally detectable in upper and lower respiratory specimens during the acute phase of infection. The lowest concentration of SARS-CoV-2 viral copies this assay can detect is 250 copies / mL. A negative result does not preclude SARS-CoV-2 infection and should not be used as the sole basis for treatment or other patient management decisions.  A negative result may occur with improper specimen collection / handling, submission of specimen other than nasopharyngeal swab, presence of viral mutation(s) within the areas targeted by this assay, and inadequate number of viral copies (<250 copies / mL). A negative result must be combined with clinical observations, patient history, and epidemiological information.  Fact Sheet for Patients:   RoadLapTop.co.za  Fact Sheet for Healthcare Providers: http://kim-miller.com/  This test is not yet approved or  cleared by the [place] FDA and has been authorized for detection and/or diagnosis of SARS-CoV-2 by FDA under an Emergency Use Authorization (EUA).  This EUA will remain in effect (meaning this test can be used) for the duration of the COVID-19 declaration under Section 564(b)(1) of the Act, 21 U.S.C. section 360bbb-3(b)(1), unless the authorization is terminated or revoked sooner.  Performed at [facility], [address], [place], [place] [address]   MRSA Next Gen by PCR, Nasal     Status: None   Collection Time: 09/12/21  4:38 PM   Specimen: Nasal Mucosa; Nasal Swab  Result Value Ref Range Status   MRSA by PCR  Next Gen NOT DETECTED NOT DETECTED Final    Comment: (NOTE) The GeneXpert MRSA Assay (FDA approved for NASAL specimens only), is one component of a comprehensive MRSA colonization surveillance program. It is not intended to diagnose MRSA infection nor to guide or monitor treatment for MRSA infections. Test performance is not FDA approved in patients less than 46 years old. Performed at [facility], [address], [place], [place] [address]     Radiology Studies: DG CHEST PORT 1 VIEW  Result Date: 09/13/2021 CLINICAL DATA:  Shortness of breath. EXAM: PORTABLE CHEST 1 VIEW COMPARISON:  September 12, 2021. FINDINGS: Stable cardiomediastinal silhouette. Hypoinflation of the lungs is noted with mild bibasilar atelectasis or infiltrates and small bilateral pleural effusions. Bony thorax is unremarkable. IMPRESSION: Stable mild bibasilar atelectasis or infiltrates are noted with small pleural effusions. Electronically Signed   By: Lupita Raider M.D.   On: 09/13/2021 13:10   DG CHEST PORT 1 VIEW  Result Date: 09/12/2021 CLINICAL DATA:  Hypoxia EXAM: PORTABLE CHEST 1 VIEW COMPARISON:  None Available. FINDINGS: Low volume chest with indistinct opacity at the bases. Small volume pleural effusion is possible. Enlarged appearance of the heart. No pneumothorax. IMPRESSION: Low volume chest with atelectasis or infiltrate at the bases. Electronically Signed   By: Tiburcio Pea M.D.   On: 09/12/2021 10:54   CT Head Wo Contrast  Result Date: 09/12/2021 CLINICAL DATA:  Mental status changes.  Low oxygen saturation. EXAM: CT HEAD WITHOUT CONTRAST TECHNIQUE: Contiguous axial images were obtained from the base of the skull through the vertex without intravenous contrast. RADIATION DOSE REDUCTION: This exam was performed according to the departmental dose-optimization program which includes automated exposure control, adjustment of the mA and/or kV according to patient size and/or use of  iterative reconstruction technique. COMPARISON:  12/21/2018 FINDINGS: Brain: There is central and cortical atrophy. Periventricular white matter changes are consistent with small vessel disease. There is no intra or extra-axial fluid collection or mass lesion. Basilar cisterns are normal. There is increased prominence  of sulci and ventricles compared with the prior study. There is no CT evidence for acute infarction or hemorrhage. Vascular: There is atherosclerotic calcification of the internal carotid arteries. No hyperdense vessel. Skull: There is atherosclerotic calcification of the internal carotid arteries. No hyperdense vessel. Sinuses/Orbits: No acute finding. Other: None. IMPRESSION: 1. Increased prominence of ventricles and sulci, consistent with increased atrophy. 2.  No evidence for acute intracranial abnormality. Electronically Signed   By: Norva Pavlov M.D.   On: 09/12/2021 08:53    Scheduled Meds:  [START ON 09/14/2021] azithromycin  500 mg Oral Daily   benztropine  1 mg Oral QHS   ciprofloxacin  2 drop Right Eye Q4H while awake   divalproex  250 mg Oral BID   enoxaparin (LOVENOX) injection  40 mg Subcutaneous Q24H   famotidine  20 mg Oral BID   guaiFENesin  1,200 mg Oral BID   ipratropium  0.5 mg Nebulization Q6H   levalbuterol  0.63 mg Nebulization Q6H   levothyroxine  75 mcg Oral Q0600   QUEtiapine  25 mg Oral QHS   sertraline  50 mg Oral Daily   Continuous Infusions:  cefTRIAXone (ROCEPHIN)  IV 1 g (09/12/21 2017)    LOS: 1 day   Marguerita Merles, DO [facility] Available via Epic secure chat 7am-7pm After these hours, please refer to coverage provider listed on [email or web address] 09/13/2021, 7:21 PM

## 2021-09-13 NOTE — Progress Notes (Signed)
PHARMACIST - PHYSICIAN COMMUNICATION  CONCERNING: Antibiotic IV to Oral Route Change Policy  RECOMMENDATION: This patient is receiving azithromycin by the intravenous route.  Based on criteria approved by the Pharmacy and Therapeutics Committee, the antibiotic(s) is/are being converted to the equivalent oral dose form(s).   DESCRIPTION: These criteria include:  Patient being treated for a respiratory tract infection, urinary tract infection, cellulitis or clostridium difficile associated diarrhea if on metronidazole  The patient is not neutropenic and does not exhibit a GI malabsorption state  The patient is eating (either orally or via tube) and/or has been taking other orally administered medications for a least 24 hours  The patient is improving clinically and has a Tmax < 100.5  If you have questions about this conversion, please contact the Pharmacy Department  []  ( [phone] )  [place] []  ( [phone] )  [facility] []  ( [phone]  [place] []  ( [phone]  [facility] [x]  ( [phone] )  [facility]  

## 2021-09-13 NOTE — Plan of Care (Signed)
  Problem: Nutrition: Goal: Adequate nutrition will be maintained Outcome: Progressing   Problem: Pain Managment: Goal: General experience of comfort will improve Outcome: Progressing   

## 2021-09-14 ENCOUNTER — Inpatient Hospital Stay (HOSPITAL_COMMUNITY): Payer: Medicare HMO

## 2021-09-14 DIAGNOSIS — J9621 Acute and chronic respiratory failure with hypoxia: Secondary | ICD-10-CM | POA: Diagnosis not present

## 2021-09-14 DIAGNOSIS — J189 Pneumonia, unspecified organism: Secondary | ICD-10-CM | POA: Diagnosis not present

## 2021-09-14 DIAGNOSIS — G3109 Other frontotemporal dementia: Secondary | ICD-10-CM | POA: Diagnosis not present

## 2021-09-14 DIAGNOSIS — H1031 Unspecified acute conjunctivitis, right eye: Secondary | ICD-10-CM | POA: Diagnosis not present

## 2021-09-14 LAB — CBC WITH DIFFERENTIAL/PLATELET
Abs Immature Granulocytes: 0.01 10*3/uL (ref 0.00–0.07)
Basophils Absolute: 0.1 10*3/uL (ref 0.0–0.1)
Basophils Relative: 1 %
Eosinophils Absolute: 0.1 10*3/uL (ref 0.0–0.5)
Eosinophils Relative: 1 %
HCT: 36.7 % (ref 36.0–46.0)
Hemoglobin: 11.4 g/dL — ABNORMAL LOW (ref 12.0–15.0)
Immature Granulocytes: 0 %
Lymphocytes Relative: 28 %
Lymphs Abs: 1.6 10*3/uL (ref 0.7–4.0)
MCH: 29.3 pg (ref 26.0–34.0)
MCHC: 31.1 g/dL (ref 30.0–36.0)
MCV: 94.3 fL (ref 80.0–100.0)
Monocytes Absolute: 0.6 10*3/uL (ref 0.1–1.0)
Monocytes Relative: 11 %
Neutro Abs: 3.3 10*3/uL (ref 1.7–7.7)
Neutrophils Relative %: 59 %
Platelets: 160 10*3/uL (ref 150–400)
RBC: 3.89 MIL/uL (ref 3.87–5.11)
RDW: 14.4 % (ref 11.5–15.5)
WBC: 5.7 10*3/uL (ref 4.0–10.5)
nRBC: 0 % (ref 0.0–0.2)

## 2021-09-14 LAB — COMPREHENSIVE METABOLIC PANEL
ALT: 19 U/L (ref 0–44)
AST: 16 U/L (ref 15–41)
Albumin: 2.7 g/dL — ABNORMAL LOW (ref 3.5–5.0)
Alkaline Phosphatase: 47 U/L (ref 38–126)
Anion gap: 6 (ref 5–15)
BUN: 17 mg/dL (ref 8–23)
CO2: 37 mmol/L — ABNORMAL HIGH (ref 22–32)
Calcium: 9.2 mg/dL (ref 8.9–10.3)
Chloride: 98 mmol/L (ref 98–111)
Creatinine, Ser: 0.71 mg/dL (ref 0.44–1.00)
GFR, Estimated: >60 mL/min (ref >60)
Glucose, Bld: 97 mg/dL (ref 70–99)
Potassium: 3.8 mmol/L (ref 3.5–5.1)
Sodium: 141 mmol/L (ref 135–145)
Total Bilirubin: 0.4 mg/dL (ref 0.3–1.2)
Total Protein: 6.1 g/dL — ABNORMAL LOW (ref 6.5–8.1)

## 2021-09-14 LAB — MAGNESIUM: Magnesium: 2 mg/dL (ref 1.7–2.4)

## 2021-09-14 LAB — PHOSPHORUS: Phosphorus: 3.5 mg/dL (ref 2.5–4.6)

## 2021-09-14 MED ORDER — FAMOTIDINE 20 MG PO TABS: 20.0000 mg | ORAL_TABLET | Freq: Two times a day (BID) | ORAL | 0 refills | Status: AC

## 2021-09-14 MED ORDER — SENNOSIDES-DOCUSATE SODIUM 8.6-50 MG PO TABS
1.0000 | ORAL_TABLET | Freq: Two times a day (BID) | ORAL | Status: DC
  Administered 2021-09-14: 1 via ORAL
  Filled 2021-09-14: qty 1

## 2021-09-14 MED ORDER — ONDANSETRON HCL 4 MG PO TABS: 4.0000 mg | ORAL_TABLET | Freq: Four times a day (QID) | ORAL | 0 refills | Status: AC | PRN

## 2021-09-14 MED ORDER — CIPROFLOXACIN HCL 0.3 % OP SOLN: 2.0000 [drp] | OPHTHALMIC | 0 refills | Status: AC

## 2021-09-14 MED ORDER — LORAZEPAM 0.5 MG PO TABS: 0.5000 mg | ORAL_TABLET | Freq: Two times a day (BID) | ORAL | 0 refills | Status: AC | PRN

## 2021-09-14 MED ORDER — LEVALBUTEROL HCL 0.63 MG/3ML IN NEBU
0.6300 mg | INHALATION_SOLUTION | Freq: Four times a day (QID) | RESPIRATORY_TRACT | 12 refills | Status: DC | PRN
Start: 2021-09-14 — End: 2021-09-14

## 2021-09-14 MED ORDER — SENNOSIDES-DOCUSATE SODIUM 8.6-50 MG PO TABS: 1.0000 | ORAL_TABLET | Freq: Every day | ORAL | 0 refills | Status: AC

## 2021-09-14 MED ORDER — BENZTROPINE MESYLATE 1 MG PO TABS: 1.0000 mg | ORAL_TABLET | Freq: Every day | ORAL | Status: AC

## 2021-09-14 MED ORDER — LEVALBUTEROL HCL 0.63 MG/3ML IN NEBU: 0.6300 mg | INHALATION_SOLUTION | Freq: Three times a day (TID) | RESPIRATORY_TRACT | 12 refills | Status: AC

## 2021-09-14 MED ORDER — BISACODYL 10 MG RE SUPP: 10.0000 mg | Freq: Every day | RECTAL | Status: DC | PRN

## 2021-09-14 MED ORDER — GUAIFENESIN ER 600 MG PO TB12: 600.0000 mg | ORAL_TABLET | Freq: Two times a day (BID) | ORAL | 0 refills | Status: AC

## 2021-09-14 MED ORDER — POLYETHYLENE GLYCOL 3350 17 G PO PACK
17.0000 g | PACK | Freq: Every day | ORAL | Status: DC
  Administered 2021-09-14: 17 g via ORAL
  Filled 2021-09-14: qty 1

## 2021-09-14 MED ORDER — IPRATROPIUM BROMIDE 0.02 % IN SOLN: 0.5000 mg | Freq: Three times a day (TID) | RESPIRATORY_TRACT | 12 refills | Status: AC

## 2021-09-14 MED ORDER — CEFDINIR 300 MG PO CAPS: 300.0000 mg | ORAL_CAPSULE | Freq: Two times a day (BID) | ORAL | 0 refills | Status: AC

## 2021-09-14 MED ORDER — ACETAMINOPHEN 325 MG PO TABS: 650.0000 mg | ORAL_TABLET | Freq: Four times a day (QID) | ORAL | 0 refills | Status: AC | PRN

## 2021-09-14 MED ORDER — AZITHROMYCIN 500 MG PO TABS: 500.0000 mg | ORAL_TABLET | Freq: Every day | ORAL | 0 refills | Status: AC

## 2021-09-14 MED ORDER — LEVALBUTEROL HCL 0.63 MG/3ML IN NEBU: 0.6300 mg | INHALATION_SOLUTION | Freq: Three times a day (TID) | RESPIRATORY_TRACT | Status: DC

## 2021-09-14 MED ORDER — QUETIAPINE FUMARATE 25 MG PO TABS: 25.0000 mg | ORAL_TABLET | Freq: Every day | ORAL | 0 refills | Status: DC

## 2021-09-14 MED ORDER — IPRATROPIUM BROMIDE 0.02 % IN SOLN
0.5000 mg | Freq: Four times a day (QID) | RESPIRATORY_TRACT | 12 refills | Status: DC | PRN
Start: 2021-09-14 — End: 2021-09-14

## 2021-09-14 MED ORDER — IPRATROPIUM BROMIDE 0.02 % IN SOLN: 0.5000 mg | Freq: Three times a day (TID) | RESPIRATORY_TRACT | Status: DC

## 2021-09-14 MED ORDER — QUETIAPINE FUMARATE 25 MG PO TABS: 25.0000 mg | ORAL_TABLET | Freq: Every day | ORAL | 6 refills | Status: AC

## 2021-09-14 MED ORDER — QUETIAPINE FUMARATE 25 MG PO TABS: 25.0000 mg | ORAL_TABLET | Freq: Every day | ORAL | Status: DC

## 2021-09-14 NOTE — Discharge Summary (Signed)
Physician Discharge Summary   Patient: Kaitlyn Bryant MRN: [id number] DOB: 10/08/1945  Admit date:     09/12/2021  Discharge date: 09/14/21  Discharge Physician: Marguerita Merles, DO   PCP: System, Provider Not In   Recommendations at discharge:   Follow-up with PCP within 1 to 2 weeks repeat CBC, CMP, mag, Phos within 1 week Repeat chest x-ray in 3 to 6 weeks  Discharge Diagnoses: Principal Problem:   Acute on chronic respiratory failure with hypoxia and hypercapnia (HCC) Active Problems:   Hypothyroid   Frontotemporal dementia with behavioral disturbance (HCC)   Gastroesophageal reflux disease   Impaired fasting glucose   Mixed dyslipidemia   Acute bacterial conjunctivitis of right eye   Community acquired bilateral lower lobe pneumonia  Resolved Problems:   * No resolved hospital problems. [facility] Course: HPI per Dr. Sanda Klein on 09/12/21 Kaitlyn Bryant is a 76 y.o. female with medical history significant of frontotemporal dementia, hyperlipidemia, hypothyroidism, glucose intolerance who was sent from her nursing facility due to altered mental status and hypoxia.  She is unable to provide further history at this time.  According to her husband, she was more somnolent earlier, but is back to baseline now.  Her husband stated that she is usually bed or wheelchair bound.  She was at her baseline yesterday when he last saw her.   ED course: Initial vital signs were temperature 98.9 F, pulse 74, respiration 18, BP 124/42 mmHg O2 sat 100% on 2 LPM via nasal cannula.  The patient received cefepime and vancomycin.  Her oxygen requirement has gone up from 2 LPM to 5-6 LPM.   Lab work: Urinalysis was cloudy, it showed ketonuria 5 mg deciliter, small leukocyte esterase and rare bacteria on microscopic examination.  Her CBC was normal.  BMP with a CO2 of 39 mmol/L, the rest of the BMP numbers were normal.   Imaging: A portable 1 view chest radiograph showed low chest volume with  atelectasis or infiltrate at the bases.  CT head without contrast with no evidence of acute intracranial abnormality.   **Interim History Per the patient's husband she is doing much better.  Patient is currently nonverbal given her advanced dementia.  Patient's husband states that she has not been coughing but he thinks that she is doing much better.  Since she remained stable she is medically stable for discharge and will need to follow-up with PCP in outpatient setting and repeat chest x-ray within 1 to 2 weeks    Assessment and Plan:  Acute on chronic respiratory failure with hypoxia and hypercapnia (HCC) In the setting of:   Community acquired bilateral lower lobe pneumonia -SpO2: 91 % O2 Flow Rate (L/min): 4 L/min FiO2 (%): 40 %; Her baseline is 2 L at home and she was weaned to 3 Liters  -Admit to PCU/inpatient.  And is improving -BiPAP as needed and nightly. -Held lorazepam twice daily but will resume and make it twice daily  -Continue supplemental oxygen. -Scheduled and as needed bronchodilators.  Changed to Xopenex and Atrovent today -Continued ceftriaxone 1 g IVPB daily and changed to po Cefdinir for 5 days total  -Continued azithromycin 500 mg IVPB daily and changed to po for 3 more days. -Check strep pneumoniae urinary antigen. -Check sputum Gram stain, culture and sensitivity. -Follow-up blood culture and sensitivity. -ABG Labs (Brief)          Component Value Date/Time    HCO3 42.9 (H) 09/13/2021 0543    O2SAT 78  09/13/2021 0543    -Follow-up CBC and chemistry in the morning. -Repeat chest x-ray done and showed "Improved aeration of the left lung base. Possible small bilateral pleural effusions." -She is medically stable to be discharged will need outpatient follow-up and repeat chest x-ray in 3 to 6 weeks.   Acute bacterial conjunctivitis of right eye -Continue antibiotic coverage as above. -Ciprofloxacin drops for 5 days     Hypothyroidism -Continue  levothyroxine 75 mcg p.o. daily.   Advanced frontotemporal dementia with behavioral disturbance (HCC) -Continue Seroquel 50 mg p.o. bedtime. -Continue sertraline 50 mg p.o. daily. -Continue Depakote 250 mg p.o. twice daily -Her benztropine was continued and she is now on 1 mg p.o. nightly. Hold lorazepam to avoid sedating effects and can likely resume some.   Gastroesophageal reflux disease/GI prophylaxis -Famotidine twice daily.   Impaired fasting glucose -Monitor fasting glucose in the outpatient setting given that blood sugars have been very well controlled here     Mixed Dyslipidemia Currently not on medical therapy except Repatha. Follow-up with primary in outpatient setting   Hypoalbuminemia -Patient's albumin level is now 2.7 -Continue monitor and trend and repeat CMP in a.m.  Consultants: None Procedures performed: None  Disposition:  Memory Care Unit at [facility] Diet recommendation:  Discharge Diet Orders (From admission, onward)     Start     Ordered   09/14/21 0000  Diet - low sodium heart healthy        09/14/21 1226           Cardiac diet DISCHARGE MEDICATION: Allergies as of 09/14/2021       Reactions   Methimazole Anaphylaxis   Sulfamethoxazole Shortness Of Breath   Alendronate Other (See Comments)   Gastroesophageal reflux    Ibandronic Acid Other (See Comments)   Gastroesophageal reflux   Risedronate Other (See Comments)   Gastroesophageal reflux    Iodine Other (See Comments)   Rash   Amoxicillin-pot Clavulanate Rash   Aspirin Other (See Comments)   unknown   Atorvastatin Other (See Comments)   Malaise    Colesevelam    Ezetimibe Other (See Comments)   Rash   Fluvastatin Other (See Comments)   myalgias   Niacin Rash   Other Other (See Comments)   unknown   Peppermint Flavor Other (See Comments)   GI upset    Pitavastatin Other (See Comments)   Gastroesophageal reflux and myalgias   Raloxifene Other (See Comments)   migraines    Rosuvastatin Other (See Comments)   myalgias and weakness on 5 mg every other day   Shellfish Allergy Swelling, Rash        Medication List     STOP taking these medications    QUEtiapine 50 MG Tb24 24 hr tablet Commonly known as: SEROQUEL XR Replaced by: QUEtiapine 25 MG tablet       TAKE these medications    acetaminophen 325 MG tablet Commonly known as: TYLENOL Take 2 tablets (650 mg total) by mouth every 6 (six) hours as needed for mild pain (or Fever >/= 101).   azithromycin 500 MG tablet Commonly known as: ZITHROMAX Take 1 tablet (500 mg total) by mouth daily for 3 days.   benztropine 1 MG tablet Commonly known as: COGENTIN Take 1 tablet (1 mg total) by mouth at bedtime. What changed:  medication strength how much to take when to take this   BIOTIN 5000 PO Take 5,000 mcg by mouth daily.   cefdinir 300 MG capsule Commonly known as:  OMNICEF Take 1 capsule (300 mg total) by mouth 2 (two) times daily for 5 days.   ciprofloxacin 0.3 % ophthalmic solution Commonly known as: CILOXAN Place 2 drops into the right eye every 4 (four) hours while awake. Administer 1 drop, every 2 hours, while awake, for 2 days. Then 1 drop, every 4 hours, while awake, for the next 5 days.   CO Q 10 PO Take 100 mg by mouth daily.   divalproex 250 MG 24 hr tablet Commonly known as: DEPAKOTE ER Take 250 mg by mouth in the morning and at bedtime.   famotidine 20 MG tablet Commonly known as: PEPCID Take 1 tablet (20 mg total) by mouth 2 (two) times daily.   guaiFENesin 600 MG 12 hr tablet Commonly known as: MUCINEX Take 1 tablet (600 mg total) by mouth 2 (two) times daily for 5 days.   ipratropium 0.02 % nebulizer solution Commonly known as: ATROVENT Take 2.5 mLs (0.5 mg total) by nebulization every 6 (six) hours as needed for wheezing or shortness of breath.   levalbuterol 0.63 MG/3ML nebulizer solution Commonly known as: XOPENEX Take 3 mLs (0.63 mg total) by nebulization  every 6 (six) hours as needed for wheezing or shortness of breath.   levothyroxine 75 MCG tablet Commonly known as: SYNTHROID TAKE 1 TABLET IN THE MORNING ON EMPTY STOMACH FOR THYROID   LORazepam 0.5 MG tablet Commonly known as: ATIVAN Take 1 tablet (0.5 mg total) by mouth 2 (two) times daily as needed for anxiety. What changed:  when to take this reasons to take this   Multi-Vitamin tablet Take 1 tablet once a day   ondansetron 4 MG tablet Commonly known as: ZOFRAN Take 1 tablet (4 mg total) by mouth every 6 (six) hours as needed for nausea.   polyethylene glycol 17 g packet Commonly known as: MIRALAX / GLYCOLAX Take 17 g by mouth daily.   PRESERVISION AREDS 2 PO Take by mouth daily.   QUEtiapine 25 MG tablet Commonly known as: SEROQUEL Take 1 tablet (25 mg total) by mouth at bedtime. Replaces: QUEtiapine 50 MG Tb24 24 hr tablet   Repatha SureClick 140 MG/ML Soaj Generic drug: Evolocumab Inject 1 mL into the skin every 14 (fourteen) days.   senna-docusate 8.6-50 MG tablet Commonly known as: Senokot-S Take 1 tablet by mouth at bedtime.   sertraline 50 MG tablet Commonly known as: ZOLOFT Take 50 mg by mouth daily.   VITAMIN D3 PO Take by mouth daily.       Discharge Exam: Filed Weights   09/12/21 1624  Weight: 76.9 kg   Vitals:   09/14/21 0810 09/14/21 1208  BP:  (!) 152/84  Pulse:  74  Resp:  16  Temp:  98.9 F (37.2 C)  SpO2: 95% 91%   Examination: Physical Exam:  Constitutional: WN/WD chronically ill-appearing elderly Caucasian female who pleasantly demented and is non-verbal due to advanced dementia Respiratory: Diminished to auscultation bilaterally, no wheezing, rales, rhonchi or crackles. Normal respiratory effort and patient is not tachypenic. No accessory muscle use. Wearing supplemental O2 via [place] Cardiovascular: RRR, no murmurs / rubs / gallops. S1 and S2 auscultated. Abdomen: Soft, non-tender, Slightly distended 2/2 body habitus. Bowel  sounds positive.  GU: Deferred. Musculoskeletal: No clubbing / cyanosis of digits/nails. No joint deformity upper and lower extremities.  Skin: No rashes, lesions, ulcers on a limited skin evaluation. No induration; Warm and dry.  Neurologic: She is non-verbal and able to track with her eyes Psychiatric: Impaired judgement and  insight. She is awake and alert   Condition at discharge: stable  The results of significant diagnostics from this hospitalization (including imaging, microbiology, ancillary and laboratory) are listed below for reference.   Imaging Studies: DG CHEST PORT 1 VIEW  Result Date: 09/14/2021 CLINICAL DATA:  Mental status changes. EXAM: PORTABLE CHEST 1 VIEW COMPARISON:  09/13/2021 FINDINGS: Improved aeration at the left lung base with residual atelectasis/airspace disease. Stable elevation of the right hemidiaphragm with probable atelectasis at the right lung base and possible component of small bilateral pleural effusions. IMPRESSION: Improved aeration of the left lung base. Possible small bilateral pleural effusions. Electronically Signed   By: Irish Lack M.D.   On: 09/14/2021 08:39   DG CHEST PORT 1 VIEW  Result Date: 09/13/2021 CLINICAL DATA:  Shortness of breath. EXAM: PORTABLE CHEST 1 VIEW COMPARISON:  September 12, 2021. FINDINGS: Stable cardiomediastinal silhouette. Hypoinflation of the lungs is noted with mild bibasilar atelectasis or infiltrates and small bilateral pleural effusions. Bony thorax is unremarkable. IMPRESSION: Stable mild bibasilar atelectasis or infiltrates are noted with small pleural effusions. Electronically Signed   By: Lupita Raider M.D.   On: 09/13/2021 13:10   DG CHEST PORT 1 VIEW  Result Date: 09/12/2021 CLINICAL DATA:  Hypoxia EXAM: PORTABLE CHEST 1 VIEW COMPARISON:  None Available. FINDINGS: Low volume chest with indistinct opacity at the bases. Small volume pleural effusion is possible. Enlarged appearance of the heart. No pneumothorax.  IMPRESSION: Low volume chest with atelectasis or infiltrate at the bases. Electronically Signed   By: Tiburcio Pea M.D.   On: 09/12/2021 10:54   CT Head Wo Contrast  Result Date: 09/12/2021 CLINICAL DATA:  Mental status changes.  Low oxygen saturation. EXAM: CT HEAD WITHOUT CONTRAST TECHNIQUE: Contiguous axial images were obtained from the base of the skull through the vertex without intravenous contrast. RADIATION DOSE REDUCTION: This exam was performed according to the departmental dose-optimization program which includes automated exposure control, adjustment of the mA and/or kV according to patient size and/or use of iterative reconstruction technique. COMPARISON:  12/21/2018 FINDINGS: Brain: There is central and cortical atrophy. Periventricular white matter changes are consistent with small vessel disease. There is no intra or extra-axial fluid collection or mass lesion. Basilar cisterns are normal. There is increased prominence of sulci and ventricles compared with the prior study. There is no CT evidence for acute infarction or hemorrhage. Vascular: There is atherosclerotic calcification of the internal carotid arteries. No hyperdense vessel. Skull: There is atherosclerotic calcification of the internal carotid arteries. No hyperdense vessel. Sinuses/Orbits: No acute finding. Other: None. IMPRESSION: 1. Increased prominence of ventricles and sulci, consistent with increased atrophy. 2.  No evidence for acute intracranial abnormality. Electronically Signed   By: Norva Pavlov M.D.   On: 09/12/2021 08:53    Microbiology: Results for orders placed or performed during the hospital encounter of 09/12/21  Blood culture (routine x 2)     Status: None (Preliminary result)   Collection Time: 09/12/21 11:48 AM   Specimen: BLOOD RIGHT HAND  Result Value Ref Range Status   Specimen Description   Final    BLOOD RIGHT HAND Performed at [facility], [address] [place],  [place] [address]    Special Requests   Final    BOTTLES DRAWN AEROBIC AND ANAEROBIC Blood Culture adequate volume Performed at [facility], [address] [place], [place] [address]    Culture   Final    NO GROWTH 2 DAYS Performed at  [facility], [address] [place], [place] [address]    Report Status PENDING  Incomplete  Blood culture (routine x 2)     Status: None (Preliminary result)   Collection Time: 09/12/21 12:27 PM   Specimen: BLOOD  Result Value Ref Range Status   Specimen Description   Final    BLOOD SITE NOT SPECIFIED Performed at [facility], [address] [place], [place] [address]    Special Requests   Final    BOTTLES DRAWN AEROBIC AND ANAEROBIC Blood Culture adequate volume Performed at [facility], [address] [place], [place] [address]    Culture   Final    NO GROWTH 2 DAYS Performed at [facility], [address], [place], [place] [address]    Report Status PENDING  Incomplete  SARS Coronavirus 2 by RT PCR (hospital order, performed in [facility] hospital lab) *cepheid single result test* Anterior Nasal Swab     Status: None   Collection Time: 09/12/21  4:38 PM   Specimen: Anterior Nasal Swab  Result Value Ref Range Status   SARS Coronavirus 2 by RT PCR NEGATIVE NEGATIVE Final    Comment: (NOTE) SARS-CoV-2 target nucleic acids are NOT DETECTED.  The SARS-CoV-2 RNA is generally detectable in upper and lower respiratory specimens during the acute phase of infection. The lowest concentration of SARS-CoV-2 viral copies this assay can detect is 250 copies / mL. A negative result does not preclude SARS-CoV-2 infection and should not be used as the sole basis for treatment or other patient management decisions.  A negative result may occur with improper specimen collection / handling, submission of specimen other than nasopharyngeal swab, presence of viral mutation(s) within the areas  targeted by this assay, and inadequate number of viral copies (<250 copies / mL). A negative result must be combined with clinical observations, patient history, and epidemiological information.  Fact Sheet for Patients:   RoadLapTop.co.za  Fact Sheet for Healthcare Providers: http://kim-miller.com/  This test is not yet approved or  cleared by the [place] FDA and has been authorized for detection and/or diagnosis of SARS-CoV-2 by FDA under an Emergency Use Authorization (EUA).  This EUA will remain in effect (meaning this test can be used) for the duration of the COVID-19 declaration under Section 564(b)(1) of the Act, 21 U.S.C. section 360bbb-3(b)(1), unless the authorization is terminated or revoked sooner.  Performed at [facility], [address], [place], [place] [address]   MRSA Next Gen by PCR, Nasal     Status: None   Collection Time: 09/12/21  4:38 PM   Specimen: Nasal Mucosa; Nasal Swab  Result Value Ref Range Status   MRSA by PCR Next Gen NOT DETECTED NOT DETECTED Final    Comment: (NOTE) The GeneXpert MRSA Assay (FDA approved for NASAL specimens only), is one component of a comprehensive MRSA colonization surveillance program. It is not intended to diagnose MRSA infection nor to guide or monitor treatment for MRSA infections. Test performance is not FDA approved in patients less than 21 years old. Performed at [facility], [address], [place], [place] [address]    Labs: CBC: Recent Labs  Lab 09/12/21 0600 09/13/21 0543 09/14/21 0454  WBC 8.2 6.4 5.7  NEUTROABS 6.0  --  3.3  HGB 13.0 12.1 11.4*  HCT 42.9 39.3 36.7  MCV 98.4 96.8 94.3  PLT 184 155 160   Basic Metabolic Panel: Recent Labs  Lab 09/12/21 0600 09/13/21 0543 09/14/21 0454  NA 144 143 141  K 4.4 3.9 3.8  CL 99 100 98  CO2 39* 38* 37*  GLUCOSE 78 94 97  BUN 21 16 17   CREATININE 0.89 0.82 0.71   CALCIUM 9.7 9.2 9.2  MG  --   --  2.0  PHOS  --   --  3.5   Liver Function Tests: Recent Labs  Lab 09/13/21 0543 09/14/21 0454  AST 18 16  ALT 20 19  ALKPHOS 50 47  BILITOT 0.5 0.4  PROT 5.9* 6.1*  ALBUMIN 2.8* 2.7*   CBG: No results for input(s): "GLUCAP" in the last 168 hours.  Discharge time spent: greater than 30 minutes.  Signed: Marguerita MerlesOmair Deloros Beretta, DO [facility] 09/14/2021

## 2021-09-14 NOTE — [place] FL2 (Signed)
[place] MEDICAID FL2 LEVEL OF CARE SCREENING TOOL     IDENTIFICATION  Patient Name: Kaitlyn Bryant Birthdate: [birth year] Sex: female Admission Date (Current Location): 09/12/2021  [facility] and [place] Number:  Producer, television/film/video and Address:  [facility],  [address] [place], [place] [address]      Provider Number: [id number]  Attending Physician Name and Address:  Merlene Laughter, DO  Relative Name and Phone Number:  RON, JUNCO [phone]  [phone]    Current Level of Care: Hospital Recommended Level of Care: Skilled Nursing Facility Prior Approval Number:    Date Approved/Denied:   PASRR Number:    Discharge Plan: SNF    Current Diagnoses: Patient Active Problem List   Diagnosis Date Noted   Acute on chronic respiratory failure with hypoxia and hypercapnia (HCC) 09/12/2021   Acute bacterial conjunctivitis of right eye 09/12/2021   Community acquired bilateral lower lobe pneumonia 09/12/2021   Frontotemporal dementia with behavioral disturbance (HCC) 05/23/2019   Hypothyroid    High cholesterol    Tinnitus 02/05/2018   Gastroesophageal reflux disease 05/09/2016   Elevated blood-pressure reading without diagnosis of hypertension 05/09/2016   Impaired fasting glucose 05/09/2016   Mixed dyslipidemia 05/09/2016   Vitamin D deficiency 05/09/2016   Osteoporosis, postmenopausal 05/09/2016    Orientation RESPIRATION BLADDER Height & Weight     Self, Place  O2 Incontinent Weight: 169 lb 8.5 oz (76.9 kg) Height:  5\' 5"  (165.1 cm)  BEHAVIORAL SYMPTOMS/MOOD NEUROLOGICAL BOWEL NUTRITION STATUS      Continent Diet  AMBULATORY STATUS COMMUNICATION OF NEEDS Skin   Extensive Assist Verbally Normal                       Personal Care Assistance Level of Assistance  Bathing, Feeding, Dressing Bathing Assistance: Maximum assistance Feeding assistance: Limited assistance Dressing Assistance: Limited assistance     Functional  Limitations Info  Sight, Speech, Hearing Sight Info: Adequate Hearing Info: Adequate Speech Info: Adequate    SPECIAL CARE FACTORS FREQUENCY  PT (By licensed PT), OT (By licensed OT)     PT Frequency: Minimum 5x a week OT Frequency: Minimum 5x a week            Contractures Contractures Info: Not present    Additional Factors Info  Code Status, Allergies, Psychotropic Code Status Info: DNR Allergies Info: Methimazole   Sulfamethoxazole   Alendronate   Ibandronic Acid   Risedronate   Iodine   Amoxicillin-pot Clavulanate   Aspirin   Atorvastatin   Colesevelam   Ezetimibe   Fluvastatin   Niacin   Other   Peppermint Flavor   Pitavastatin   Raloxifene   Rosuvastatin   Shellfish Allergy Psychotropic Info: divalproex (DEPAKOTE ER) 24 hr tablet 250 mg, QUEtiapine (SEROQUEL) tablet 25 mg, sertraline (ZOLOFT) tablet 50 mg         Current Medications (09/14/2021):  This is the current hospital active medication list Current Facility-Administered Medications  Medication Dose Route Frequency Provider Last Rate Last Admin   acetaminophen (TYLENOL) tablet 650 mg  650 mg Oral Q6H PRN 11/14/2021, MD   650 mg at 09/13/21 2249   Or   acetaminophen (TYLENOL) suppository 650 mg  650 mg Rectal Q6H PRN 11/13/21, MD       azithromycin [facility]) tablet 500 mg  500 mg Oral Daily [facility], [facility]   500 mg at 09/14/21 1025   benztropine (COGENTIN) tablet 1 mg  1 mg Oral QHS Bobette Mo, MD   1 mg at 09/13/21 2249   bisacodyl (DULCOLAX) suppository 10 mg  10 mg Rectal Daily PRN Marguerita Merles Latif, DO       cefTRIAXone (ROCEPHIN) 1 g in sodium chloride 0.9 % 100 mL IVPB  1 g Intravenous Q24H Bobette Mo, MD 200 mL/hr at 09/13/21 2040 1 g at 09/13/21 2040   ciprofloxacin (CILOXAN) 0.3 % ophthalmic solution 2 drop  2 drop Right Eye Q4H while awake Bobette Mo, MD   2 drop at 09/14/21 1027   divalproex (DEPAKOTE ER) 24 hr tablet 250 mg  250 mg Oral BID Bobette Mo, MD   250 mg at 09/14/21 1027   enoxaparin (LOVENOX) injection 40 mg  40 mg Subcutaneous Q24H Bobette Mo, MD   40 mg at 09/13/21 2250   famotidine (PEPCID) tablet 20 mg  20 mg Oral BID Bobette Mo, MD   20 mg at 09/14/21 1027   guaiFENesin (MUCINEX) 12 hr tablet 1,200 mg  1,200 mg Oral BID Marguerita Merles Latif, DO   1,200 mg at 09/14/21 1026   ipratropium (ATROVENT) nebulizer solution 0.5 mg  0.5 mg Nebulization Q6H Sheikh, Kateri Mc Latif, DO   0.5 mg at 09/14/21 0809   ipratropium-albuterol (DUONEB) 0.5-2.5 (3) MG/3ML nebulizer solution 3 mL  3 mL Nebulization Q4H PRN Bobette Mo, MD       levalbuterol [facility]) nebulizer solution 0.63 mg  0.63 mg Nebulization Q6H Marguerita Merles [place], DO   0.63 mg at 09/14/21 0809   levothyroxine (SYNTHROID) tablet 75 mcg  75 mcg Oral Q0600 Bobette Mo, MD   75 mcg at 09/14/21 0641   ondansetron (ZOFRAN) tablet 4 mg  4 mg Oral Q6H PRN Bobette Mo, MD       Or   ondansetron [facility]) injection 4 mg  4 mg Intravenous Q6H PRN Bobette Mo, MD       polyethylene glycol [facility]) packet 17 g  17 g Oral Daily Sheikh, Omair Latif, DO       QUEtiapine (SEROQUEL) tablet 25 mg  25 mg Oral QHS Bobette Mo, MD   25 mg at 09/13/21 2249   senna-docusate (Senokot-S) tablet 1 tablet  1 tablet Oral BID Sheikh, Omair Latif, DO       sertraline (ZOLOFT) tablet 50 mg  50 mg Oral Daily Bobette Mo, MD   50 mg at 09/14/21 1026     Discharge Medications: Please see discharge summary for a list of discharge medications.  Relevant Imaging Results:  Relevant Lab Results:   Additional Information SSN [id number]  Darleene Cleaver, LCSW

## 2021-09-14 NOTE — TOC Transition Note (Addendum)
Transition of Care [facility] - CM/SW Discharge Note   Patient Details  Name: KARESS HARNER MRN: [id number] Date of Birth: 30-May-1945  Transition of Care [facility]) CM/SW Contact:  Darleene Cleaver, LCSW Phone Number: 09/14/2021, 4:59 PM   Clinical Narrative:     Patient is LTC resident at [facility].  CSW spoke to [place], [phone], she confirmed patient can return back to SNF today.  Patient to be d/c'ed today to [facility] room 310.  Patient and family agreeable to plans will transport via ems RN to call report [phone].  CSW spoke to patient's husband he is aware that she is discharging today.  Final next level of care: Skilled Nursing Facility Barriers to Discharge: Barriers Resolved   Patient Goals and CMS Choice Patient states their goals for this hospitalization and ongoing recovery are:: To return back to [facility].gov Compare Post Acute Care list provided to:: Patient Represenative (must comment) Choice offered to / list presented to : Spouse  Discharge Placement              Patient chooses bed at: [facility] Patient to be transferred to facility by: [facility] Name of family member notified: Patients husband Patient and family notified of of transfer: 09/14/21  Discharge Plan and Services   Discharge Planning Services: CM Consult Post Acute Care Choice: Nursing Home                               Social Determinants of Health (SDOH) Interventions     Readmission Risk Interventions     No data to display

## 2021-09-14 NOTE — TOC Progression Note (Addendum)
Transition of Care [facility]) - Progression Note    Patient Details  Name: Kaitlyn Bryant MRN: [id number] Date of Birth: 06/10/1945  Transition [facility] Care [facility]) CM/SW Contact  Darleene Cleaver, [place] Phone Number: 09/14/2021, 12:51 PM  Clinical Narrative:     CSW was informed that patient will be ready for discharge today to [facility] where she is LTC.  CSW attempted to contact [facility] to see if patient can return today.  CSW left a message awaiting for a call back.  1:45pm CSW attempted to call nurse supervisor on call, left another message awaiting for a call back.  2:30pm  CSW attempted to call [facility] nurse on call again, left another message, awaiting for a call back.  3:45pm  CSW received phone call back from [place] nurse supervisor at [place], who confirmed patient can return back today.  She will need the [facility] and DC summary faxed to her at [phone] and [phone].    Expected Discharge Plan: Skilled Nursing Facility Barriers to Discharge: Continued Medical Work up  Expected Discharge Plan and Services Expected Discharge Plan: Skilled Nursing Facility   Discharge Planning Services: CM Consult Post Acute Care Choice: Nursing Home Living arrangements for the past 2 months:  (LTC) Expected Discharge Date: 09/14/21                                     Social Determinants of Health (SDOH) Interventions    Readmission Risk Interventions     No data to display

## 2021-09-14 NOTE — Progress Notes (Signed)
NOTIFIED SPOUSE MR. [place] OF PT BEING TRANSFERRED BACK TO FACILITY @21 :20

## 2021-09-16 DIAGNOSIS — Z79899 Other long term (current) drug therapy: Secondary | ICD-10-CM | POA: Diagnosis not present

## 2021-09-17 DIAGNOSIS — Z79899 Other long term (current) drug therapy: Secondary | ICD-10-CM | POA: Diagnosis not present

## 2021-09-17 DIAGNOSIS — F0394 Unspecified dementia, unspecified severity, with anxiety: Secondary | ICD-10-CM | POA: Diagnosis not present

## 2021-09-17 DIAGNOSIS — N182 Chronic kidney disease, stage 2 (mild): Secondary | ICD-10-CM | POA: Diagnosis not present

## 2021-09-17 DIAGNOSIS — Z7689 Persons encountering health services in other specified circumstances: Secondary | ICD-10-CM | POA: Diagnosis not present

## 2021-09-17 LAB — CULTURE, BLOOD (ROUTINE X 2)
Culture: NO GROWTH
Culture: NO GROWTH
Special Requests: ADEQUATE
Special Requests: ADEQUATE

## 2021-09-18 DIAGNOSIS — E569 Vitamin deficiency, unspecified: Secondary | ICD-10-CM | POA: Diagnosis not present

## 2021-09-19 DIAGNOSIS — E569 Vitamin deficiency, unspecified: Secondary | ICD-10-CM | POA: Diagnosis not present

## 2021-09-20 DIAGNOSIS — E875 Hyperkalemia: Secondary | ICD-10-CM | POA: Diagnosis not present

## 2021-09-20 DIAGNOSIS — J9622 Acute and chronic respiratory failure with hypercapnia: Secondary | ICD-10-CM | POA: Diagnosis not present

## 2021-09-20 DIAGNOSIS — Z7689 Persons encountering health services in other specified circumstances: Secondary | ICD-10-CM | POA: Diagnosis not present

## 2021-09-20 DIAGNOSIS — R627 Adult failure to thrive: Secondary | ICD-10-CM | POA: Diagnosis not present

## 2021-09-20 DIAGNOSIS — F0394 Unspecified dementia, unspecified severity, with anxiety: Secondary | ICD-10-CM | POA: Diagnosis not present

## 2021-09-30 DIAGNOSIS — L03115 Cellulitis of right lower limb: Secondary | ICD-10-CM | POA: Diagnosis not present

## 2021-10-01 DIAGNOSIS — R627 Adult failure to thrive: Secondary | ICD-10-CM | POA: Diagnosis not present

## 2021-10-01 DIAGNOSIS — R0682 Tachypnea, not elsewhere classified: Secondary | ICD-10-CM | POA: Diagnosis not present

## 2021-10-01 DIAGNOSIS — F0390 Unspecified dementia without behavioral disturbance: Secondary | ICD-10-CM | POA: Diagnosis not present
